# Patient Record
Sex: Female | Born: 1960 | Race: White | Hispanic: No | Marital: Married | Smoking: Former smoker
Health system: Southern US, Community
[De-identification: ages and names within clinical notes are randomized; demographics above are authoritative.]

## PROBLEM LIST (undated history)

## (undated) ENCOUNTER — Emergency Department (HOSPITAL_COMMUNITY): Admission: EM | Discharge status: Against Medical Advice | Payer: Private Health Insurance - Indemnity

## (undated) DIAGNOSIS — C44621 Squamous cell carcinoma of skin of unspecified upper limb, including shoulder: Secondary | ICD-10-CM

## (undated) DIAGNOSIS — M26629 Arthralgia of temporomandibular joint, unspecified side: Secondary | ICD-10-CM

## (undated) DIAGNOSIS — E785 Hyperlipidemia, unspecified: Secondary | ICD-10-CM

## (undated) DIAGNOSIS — T7840XA Allergy, unspecified, initial encounter: Secondary | ICD-10-CM

## (undated) DIAGNOSIS — J189 Pneumonia, unspecified organism: Secondary | ICD-10-CM

## (undated) DIAGNOSIS — R609 Edema, unspecified: Secondary | ICD-10-CM

## (undated) DIAGNOSIS — R51 Headache: Secondary | ICD-10-CM

## (undated) DIAGNOSIS — I10 Essential (primary) hypertension: Secondary | ICD-10-CM

## (undated) DIAGNOSIS — K219 Gastro-esophageal reflux disease without esophagitis: Secondary | ICD-10-CM

## (undated) HISTORY — DX: Headache: R51

## (undated) HISTORY — DX: Squamous cell carcinoma of skin of unspecified upper limb, including shoulder: C44.621

## (undated) HISTORY — DX: Gastro-esophageal reflux disease without esophagitis: K21.9

## (undated) HISTORY — DX: Allergy, unspecified, initial encounter: T78.40XA

## (undated) HISTORY — DX: Arthralgia of temporomandibular joint, unspecified side: M26.629

## (undated) HISTORY — DX: Hyperlipidemia, unspecified: E78.5

## (undated) HISTORY — DX: Edema, unspecified: R60.9

## (undated) HISTORY — PX: DENTAL SURGERY: SHX609

---

## 1999-05-20 ENCOUNTER — Encounter: Admission: RE | Admit: 1999-05-20 | Discharge: 1999-05-20 | Payer: Self-pay | Admitting: Obstetrics and Gynecology

## 1999-05-20 ENCOUNTER — Encounter: Payer: Self-pay | Admitting: Obstetrics and Gynecology

## 1999-11-18 ENCOUNTER — Encounter: Payer: Self-pay | Admitting: Obstetrics and Gynecology

## 1999-11-18 ENCOUNTER — Encounter: Admission: RE | Admit: 1999-11-18 | Discharge: 1999-11-18 | Payer: Self-pay | Admitting: Obstetrics and Gynecology

## 2000-12-16 ENCOUNTER — Encounter: Admission: RE | Admit: 2000-12-16 | Discharge: 2000-12-16 | Payer: Self-pay | Admitting: Obstetrics and Gynecology

## 2000-12-16 ENCOUNTER — Encounter: Payer: Self-pay | Admitting: Obstetrics and Gynecology

## 2001-07-22 ENCOUNTER — Other Ambulatory Visit: Admission: RE | Admit: 2001-07-22 | Discharge: 2001-07-22 | Payer: Self-pay | Admitting: Obstetrics and Gynecology

## 2002-04-12 ENCOUNTER — Encounter: Payer: Self-pay | Admitting: Obstetrics and Gynecology

## 2002-04-12 ENCOUNTER — Encounter: Admission: RE | Admit: 2002-04-12 | Discharge: 2002-04-12 | Payer: Self-pay | Admitting: Obstetrics and Gynecology

## 2002-10-17 ENCOUNTER — Other Ambulatory Visit: Admission: RE | Admit: 2002-10-17 | Discharge: 2002-10-17 | Payer: Self-pay | Admitting: Obstetrics and Gynecology

## 2003-05-26 ENCOUNTER — Encounter: Admission: RE | Admit: 2003-05-26 | Discharge: 2003-05-26 | Payer: Self-pay | Admitting: Obstetrics and Gynecology

## 2003-12-19 ENCOUNTER — Other Ambulatory Visit: Admission: RE | Admit: 2003-12-19 | Discharge: 2003-12-19 | Payer: Self-pay | Admitting: Obstetrics and Gynecology

## 2004-08-27 ENCOUNTER — Encounter: Admission: RE | Admit: 2004-08-27 | Discharge: 2004-08-27 | Payer: Self-pay | Admitting: Obstetrics and Gynecology

## 2004-10-25 ENCOUNTER — Ambulatory Visit: Payer: Self-pay | Admitting: Family Medicine

## 2004-12-12 ENCOUNTER — Other Ambulatory Visit: Admission: RE | Admit: 2004-12-12 | Discharge: 2004-12-12 | Payer: Self-pay | Admitting: Obstetrics and Gynecology

## 2005-08-22 ENCOUNTER — Ambulatory Visit: Payer: Self-pay | Admitting: Family Medicine

## 2005-08-27 ENCOUNTER — Ambulatory Visit: Payer: Self-pay | Admitting: Family Medicine

## 2005-08-28 ENCOUNTER — Encounter: Admission: RE | Admit: 2005-08-28 | Discharge: 2005-08-28 | Payer: Self-pay | Admitting: Obstetrics and Gynecology

## 2006-08-03 ENCOUNTER — Ambulatory Visit: Payer: Self-pay | Admitting: Family Medicine

## 2006-08-31 ENCOUNTER — Encounter: Admission: RE | Admit: 2006-08-31 | Discharge: 2006-08-31 | Payer: Self-pay | Admitting: Obstetrics and Gynecology

## 2007-03-04 ENCOUNTER — Ambulatory Visit: Payer: Self-pay | Admitting: Family Medicine

## 2007-03-04 DIAGNOSIS — R51 Headache: Secondary | ICD-10-CM

## 2007-03-04 DIAGNOSIS — R519 Headache, unspecified: Secondary | ICD-10-CM | POA: Insufficient documentation

## 2007-03-04 DIAGNOSIS — M26629 Arthralgia of temporomandibular joint, unspecified side: Secondary | ICD-10-CM

## 2007-07-19 ENCOUNTER — Telehealth: Payer: Self-pay | Admitting: Family Medicine

## 2007-09-09 ENCOUNTER — Ambulatory Visit: Payer: Self-pay | Admitting: Family Medicine

## 2007-09-09 DIAGNOSIS — J019 Acute sinusitis, unspecified: Secondary | ICD-10-CM

## 2007-09-13 ENCOUNTER — Telehealth: Payer: Self-pay | Admitting: Family Medicine

## 2007-09-27 ENCOUNTER — Encounter: Admission: RE | Admit: 2007-09-27 | Discharge: 2007-09-27 | Payer: Self-pay | Admitting: Obstetrics and Gynecology

## 2007-09-30 ENCOUNTER — Telehealth: Payer: Self-pay | Admitting: Family Medicine

## 2007-10-05 ENCOUNTER — Encounter: Admission: RE | Admit: 2007-10-05 | Discharge: 2007-10-05 | Payer: Self-pay | Admitting: Obstetrics and Gynecology

## 2007-11-10 ENCOUNTER — Ambulatory Visit: Payer: Self-pay | Admitting: Family Medicine

## 2007-11-10 DIAGNOSIS — R609 Edema, unspecified: Secondary | ICD-10-CM | POA: Insufficient documentation

## 2007-12-02 ENCOUNTER — Telehealth: Payer: Self-pay | Admitting: Family Medicine

## 2007-12-03 ENCOUNTER — Ambulatory Visit: Payer: Self-pay | Admitting: Family Medicine

## 2007-12-03 DIAGNOSIS — J309 Allergic rhinitis, unspecified: Secondary | ICD-10-CM | POA: Insufficient documentation

## 2007-12-06 LAB — CONVERTED CEMR LAB
Alkaline Phosphatase: 95 units/L (ref 39–117)
Basophils Relative: 0.8 % (ref 0.0–1.0)
CO2: 26 meq/L (ref 19–32)
Creatinine, Ser: 0.8 mg/dL (ref 0.4–1.2)
Eosinophils Absolute: 0.1 10*3/uL (ref 0.0–0.7)
Eosinophils Relative: 1.7 % (ref 0.0–5.0)
Glucose, Bld: 83 mg/dL (ref 70–99)
HCT: 41.8 % (ref 36.0–46.0)
Hemoglobin: 14.4 g/dL (ref 12.0–15.0)
Ketones, urine, test strip: NEGATIVE
Lymphocytes Relative: 22.6 % (ref 12.0–46.0)
MCHC: 34.4 g/dL (ref 30.0–36.0)
Monocytes Relative: 5.1 % (ref 3.0–12.0)
Neutro Abs: 4.7 10*3/uL (ref 1.4–7.7)
Neutrophils Relative %: 69.8 % (ref 43.0–77.0)
Nitrite: NEGATIVE
Protein, U semiquant: NEGATIVE
RBC: 4.61 M/uL (ref 3.87–5.11)
RDW: 11.6 % (ref 11.5–14.6)
Specific Gravity, Urine: 1.02
TSH: 1.51 microintl units/mL (ref 0.35–5.50)
Urobilinogen, UA: 0.2
WBC: 6.7 10*3/uL (ref 4.5–10.5)

## 2007-12-17 ENCOUNTER — Telehealth: Payer: Self-pay | Admitting: Family Medicine

## 2007-12-27 ENCOUNTER — Ambulatory Visit: Payer: Self-pay | Admitting: Family Medicine

## 2007-12-27 DIAGNOSIS — R059 Cough, unspecified: Secondary | ICD-10-CM | POA: Insufficient documentation

## 2007-12-27 DIAGNOSIS — R05 Cough: Secondary | ICD-10-CM

## 2008-01-20 ENCOUNTER — Ambulatory Visit: Payer: Self-pay | Admitting: Family Medicine

## 2008-03-15 ENCOUNTER — Ambulatory Visit: Payer: Self-pay | Admitting: Family Medicine

## 2008-03-31 ENCOUNTER — Telehealth: Payer: Self-pay | Admitting: Family Medicine

## 2008-07-13 ENCOUNTER — Telehealth (INDEPENDENT_AMBULATORY_CARE_PROVIDER_SITE_OTHER): Payer: Self-pay | Admitting: *Deleted

## 2008-07-25 ENCOUNTER — Ambulatory Visit: Payer: Self-pay | Admitting: Pulmonary Disease

## 2008-08-08 ENCOUNTER — Telehealth: Payer: Self-pay | Admitting: Pulmonary Disease

## 2008-08-15 ENCOUNTER — Ambulatory Visit: Payer: Self-pay | Admitting: Pulmonary Disease

## 2008-08-21 ENCOUNTER — Ambulatory Visit: Payer: Self-pay | Admitting: Family Medicine

## 2008-09-08 ENCOUNTER — Telehealth (INDEPENDENT_AMBULATORY_CARE_PROVIDER_SITE_OTHER): Payer: Self-pay | Admitting: *Deleted

## 2008-10-09 ENCOUNTER — Encounter: Admission: RE | Admit: 2008-10-09 | Discharge: 2008-10-09 | Payer: Self-pay | Admitting: Obstetrics and Gynecology

## 2008-10-24 ENCOUNTER — Ambulatory Visit: Payer: Self-pay | Admitting: Pulmonary Disease

## 2008-10-25 ENCOUNTER — Ambulatory Visit: Payer: Self-pay | Admitting: Cardiology

## 2008-11-09 ENCOUNTER — Telehealth (INDEPENDENT_AMBULATORY_CARE_PROVIDER_SITE_OTHER): Payer: Self-pay | Admitting: *Deleted

## 2008-11-15 ENCOUNTER — Telehealth (INDEPENDENT_AMBULATORY_CARE_PROVIDER_SITE_OTHER): Payer: Self-pay | Admitting: *Deleted

## 2008-11-27 ENCOUNTER — Telehealth (INDEPENDENT_AMBULATORY_CARE_PROVIDER_SITE_OTHER): Payer: Self-pay | Admitting: *Deleted

## 2008-12-12 ENCOUNTER — Telehealth (INDEPENDENT_AMBULATORY_CARE_PROVIDER_SITE_OTHER): Payer: Self-pay | Admitting: *Deleted

## 2009-03-05 ENCOUNTER — Ambulatory Visit: Payer: Self-pay | Admitting: Family Medicine

## 2009-03-05 DIAGNOSIS — I10 Essential (primary) hypertension: Secondary | ICD-10-CM

## 2009-03-06 LAB — CONVERTED CEMR LAB
ALT: 15 units/L (ref 0–35)
Alkaline Phosphatase: 93 units/L (ref 39–117)
BUN: 9 mg/dL (ref 6–23)
Calcium: 8.8 mg/dL (ref 8.4–10.5)
Chloride: 106 meq/L (ref 96–112)
Creatinine, Ser: 0.7 mg/dL (ref 0.4–1.2)
GFR calc non Af Amer: 94.77 mL/min (ref >60)
Glucose, Bld: 102 mg/dL — ABNORMAL HIGH (ref 70–99)
Lymphocytes Relative: 27.5 % (ref 12.0–46.0)
MCV: 91.9 fL (ref 78.0–100.0)
Neutrophils Relative %: 64.8 % (ref 43.0–77.0)
Potassium: 3.9 meq/L (ref 3.5–5.1)
RBC: 4.4 M/uL (ref 3.87–5.11)
RDW: 11.8 % (ref 11.5–14.6)
Sodium: 139 meq/L (ref 135–145)
Total Bilirubin: 0.6 mg/dL (ref 0.3–1.2)
Total Protein: 6.8 g/dL (ref 6.0–8.3)
WBC: 5.3 10*3/uL (ref 4.5–10.5)

## 2009-03-19 ENCOUNTER — Ambulatory Visit: Payer: Self-pay | Admitting: Pulmonary Disease

## 2009-03-27 ENCOUNTER — Ambulatory Visit: Payer: Self-pay | Admitting: Family Medicine

## 2009-04-24 ENCOUNTER — Ambulatory Visit: Payer: Self-pay | Admitting: Pulmonary Disease

## 2009-04-24 ENCOUNTER — Ambulatory Visit: Payer: Self-pay | Admitting: Family Medicine

## 2009-05-29 ENCOUNTER — Ambulatory Visit: Payer: Self-pay | Admitting: Pulmonary Disease

## 2009-10-23 ENCOUNTER — Encounter: Admission: RE | Admit: 2009-10-23 | Discharge: 2009-10-23 | Payer: Self-pay | Admitting: Obstetrics and Gynecology

## 2010-03-01 ENCOUNTER — Ambulatory Visit: Payer: Self-pay | Admitting: Family Medicine

## 2010-03-04 ENCOUNTER — Encounter: Payer: Self-pay | Admitting: Family Medicine

## 2010-03-04 LAB — CONVERTED CEMR LAB
Alkaline Phosphatase: 95 units/L (ref 39–117)
Basophils Absolute: 0 10*3/uL (ref 0.0–0.1)
Basophils Relative: 0.6 % (ref 0.0–3.0)
Bilirubin, Direct: 0.1 mg/dL (ref 0.0–0.3)
Calcium: 9 mg/dL (ref 8.4–10.5)
Chloride: 104 meq/L (ref 96–112)
Cholesterol: 231 mg/dL — ABNORMAL HIGH (ref 0–200)
Creatinine, Ser: 0.8 mg/dL (ref 0.4–1.2)
Direct LDL: 171 mg/dL
GFR calc non Af Amer: 85.84 mL/min (ref >60)
Glucose, Bld: 74 mg/dL (ref 70–99)
HCT: 41.4 % (ref 36.0–46.0)
HDL: 50.7 mg/dL (ref >39.00)
Hemoglobin: 14.4 g/dL (ref 12.0–15.0)
Lymphocytes Relative: 22.2 % (ref 12.0–46.0)
Monocytes Relative: 5 % (ref 3.0–12.0)
RDW: 12.9 % (ref 11.5–14.6)
Sodium: 140 meq/L (ref 135–145)
TSH: 0.85 microintl units/mL (ref 0.35–5.50)
Total Bilirubin: 0.6 mg/dL (ref 0.3–1.2)
Total CHOL/HDL Ratio: 5
Triglycerides: 115 mg/dL (ref 0.0–149.0)
VLDL: 23 mg/dL (ref 0.0–40.0)
WBC: 7.4 10*3/uL (ref 4.5–10.5)

## 2010-07-28 ENCOUNTER — Encounter: Payer: Self-pay | Admitting: Obstetrics and Gynecology

## 2010-08-06 NOTE — Miscellaneous (Signed)
  Clinical Lists Changes  Medications: Added new medication of ZOCOR 40 MG TABS (SIMVASTATIN) 1 once daily - Signed Rx of ZOCOR 40 MG TABS (SIMVASTATIN) 1 once daily;  #30 x 11;  Signed;  Entered by: Raechel Ache, RN;  Authorized by: Nelwyn Salisbury MD;  Method used: [facility] [phone]*, [address], [place], [place]  [address], Ph: [phone] or [phone], Fax: [phone]    Prescriptions: ZOCOR 40 MG TABS (SIMVASTATIN) 1 once daily  #30 x 11   Entered by:   Raechel Ache, RN   Authorized by:   Nelwyn Salisbury MD   Signed by:   Raechel Ache, RN on 03/04/2010   Method used:   Electronically to        [facility] [phone]* (retail)       [address] [place], [place]  [address]       Ph: [phone] or [phone]       Fax: [phone]   RxID:   [id number]

## 2010-08-06 NOTE — Assessment & Plan Note (Signed)
Summary: HTN F/U - MED CK / REFILL  // RS   Vital Signs:  Patient profile:   50 year old female Weight:      182 pounds BMI:     33.41 BP sitting:   140 / 86  (left arm) Cuff size:   regular  Vitals Entered By: Raechel Ache, RN (March 01, 2010 9:43 AM) CC: Med check.   History of Present Illness: Here to follow up on HTN. She feels fine, and her BP is steady at home in the 130s over 80s.   Allergies: 1)  ! Augmentin  Past History:  Past Medical History: sees Dr. Jarold Song for GYN exams COUGH (ICD-786.2) ALLERGIC RHINITIS (ICD-477.9) DEPENDENT EDEMA (ICD-782.3) TMJ PAIN (ICD-524.62) HEADACHE (ICD-784.0)     Review of Systems  The patient denies anorexia, fever, weight loss, weight gain, vision loss, decreased hearing, hoarseness, chest pain, syncope, dyspnea on exertion, peripheral edema, prolonged cough, headaches, hemoptysis, abdominal pain, melena, hematochezia, severe indigestion/heartburn, hematuria, incontinence, genital sores, muscle weakness, suspicious skin lesions, transient blindness, difficulty walking, depression, unusual weight change, abnormal bleeding, enlarged lymph nodes, angioedema, breast masses, and testicular masses.    Physical Exam  General:  overweight-appearing.   Neck:  No deformities, masses, or tenderness noted. Lungs:  Normal respiratory effort, chest expands symmetrically. Lungs are clear to auscultation, no crackles or wheezes. Heart:  Normal rate and regular rhythm. S1 and S2 normal without gallop, murmur, click, rub or other extra sounds. Extremities:  no edema   Impression & Recommendations:  Problem # 1:  HYPERTENSION (ICD-401.9)  Her updated medication list for this problem includes:    Furosemide 40 Mg Tabs (Furosemide) ..... Once daily    Diovan 320 Mg Tabs (Valsartan) ..... Once daily  Orders: UA Dipstick w/o Micro (automated)  (81003) Venipuncture ([address]109) TLB-Lipid Panel (80061-LIPID) TLB-BMP (Basic Metabolic  Panel-BMET) (80048-METABOL) TLB-CBC Platelet - w/Differential (85025-CBCD) TLB-Hepatic/Liver Function Pnl (80076-HEPATIC) TLB-TSH (Thyroid Stimulating Hormone) (84443-TSH)  Problem # 2:  DEPENDENT EDEMA (ICD-782.3)  Her updated medication list for this problem includes:    Furosemide 40 Mg Tabs (Furosemide) ..... Once daily  Complete Medication List: 1)  Sprintec 28 0.25-35 Mg-mcg Tabs (Norgestimate-eth estradiol) .Marland Kitchen.. 1 by mouth once daily 2)  Furosemide 40 Mg Tabs (Furosemide) .... Once daily 3)  Diovan 320 Mg Tabs (Valsartan) .... Once daily  Patient Instructions: 1)  get labs  Prescriptions: DIOVAN 320 MG TABS (VALSARTAN) once daily  #30 x 11   Entered and Authorized by:   Nelwyn Salisbury MD   Signed by:   Nelwyn Salisbury MD on 03/01/2010   Method used:   Electronically to        [facility] [phone]* (retail)       [address] [place], [place]  [address]       Ph: [phone] or [phone]       Fax: [phone]   RxID:   [id number] FUROSEMIDE 40 MG TABS (FUROSEMIDE) once daily  #30 x 11   Entered and Authorized by:   Nelwyn Salisbury MD   Signed by:   Nelwyn Salisbury MD on 03/01/2010   Method used:   Electronically to        [facility] [phone]* (retail)       [address] [place], [place]  [address]       Ph: [phone] or [phone]  Fax: [phone]   RxID:   [id number]   Appended Document: Orders Update    Clinical Lists Changes  Orders: Added new Service order of Specimen Handling (30865) - Signed      Appended Document: HTN F/U - MED CK / REFILL  // RS   ANTICOAGULATION RECORD  NEW REGIMEN & LAB RESULTS Regimen:   (no change)    Laboratory Results   Urine Tests    Routine Urinalysis   Color: yellow Appearance: Clear Glucose: negative   (Normal Range: Negative) Bilirubin: negative   (Normal Range: Negative) Ketone: negative   (Normal Range: Negative) Spec. Gravity: 1.015   (Normal Range:  1.003-1.035) Blood: negative   (Normal Range: Negative) pH: 5.0   (Normal Range: 5.0-8.0) Protein: negative   (Normal Range: Negative) Urobilinogen: 0.2   (Normal Range: 0-1) Nitrite: negative   (Normal Range: Negative) Leukocyte Esterace: negative   (Normal Range: Negative)    Comments: Rita Ohara  March 01, 2010 12:04 PM

## 2010-10-04 ENCOUNTER — Other Ambulatory Visit: Payer: Self-pay | Admitting: Obstetrics and Gynecology

## 2010-10-04 DIAGNOSIS — Z1231 Encounter for screening mammogram for malignant neoplasm of breast: Secondary | ICD-10-CM

## 2010-10-25 ENCOUNTER — Ambulatory Visit
Admission: RE | Admit: 2010-10-25 | Discharge: 2010-10-25 | Disposition: A | Discharge status: Home / Self Care | Payer: Managed Care, Other (non HMO) | Source: Ambulatory Visit | Attending: Obstetrics and Gynecology | Admitting: Obstetrics and Gynecology

## 2010-10-25 DIAGNOSIS — Z1231 Encounter for screening mammogram for malignant neoplasm of breast: Secondary | ICD-10-CM

## 2011-03-04 ENCOUNTER — Ambulatory Visit (INDEPENDENT_AMBULATORY_CARE_PROVIDER_SITE_OTHER): Payer: Managed Care, Other (non HMO) | Admitting: Family Medicine

## 2011-03-04 ENCOUNTER — Encounter: Payer: Self-pay | Admitting: Family Medicine

## 2011-03-04 VITALS — BP 138/90 | HR 114 | Temp 98.3°F | Wt 185.0 lb

## 2011-03-04 DIAGNOSIS — E785 Hyperlipidemia, unspecified: Secondary | ICD-10-CM

## 2011-03-04 DIAGNOSIS — I10 Essential (primary) hypertension: Secondary | ICD-10-CM

## 2011-03-04 MED ORDER — VALSARTAN 320 MG PO TABS: 320.0000 mg | ORAL_TABLET | Freq: Every day | ORAL | Status: DC

## 2011-03-04 MED ORDER — SIMVASTATIN 40 MG PO TABS: 40.0000 mg | ORAL_TABLET | Freq: Every day | ORAL | Status: DC

## 2011-03-04 MED ORDER — FUROSEMIDE 40 MG PO TABS: 40.0000 mg | ORAL_TABLET | Freq: Every day | ORAL | Status: DC

## 2011-03-04 NOTE — Progress Notes (Signed)
  Subjective:    Patient ID: Heidi Crawford, female    DOB: [birth year], 51 y.o.   MRN: [id number]  HPI Here for refills. She feels fine but she knows she needs to lose weight. She has put on 3 lbs form a year ago. Her BP at home is stable.    Review of Systems  Constitutional: Negative.   Respiratory: Negative.   Cardiovascular: Negative.        Objective:   Physical Exam  Constitutional: She appears well-developed and well-nourished.  Neck: No thyromegaly present.  Cardiovascular: Normal rate, regular rhythm, normal heart sounds and intact distal pulses.   Pulmonary/Chest: Effort normal and breath sounds normal.  Lymphadenopathy:    She has no cervical adenopathy.          Assessment & Plan:  We discussed her diet and exercise habits. She will set up fasting labs soon.

## 2011-03-05 ENCOUNTER — Other Ambulatory Visit: Payer: Self-pay | Admitting: Family Medicine

## 2011-03-05 NOTE — Telephone Encounter (Signed)
Script sent e-scribe 

## 2011-03-19 ENCOUNTER — Other Ambulatory Visit: Payer: Self-pay | Admitting: Family Medicine

## 2011-07-14 ENCOUNTER — Encounter: Payer: Self-pay | Admitting: *Deleted

## 2011-07-14 ENCOUNTER — Emergency Department (INDEPENDENT_AMBULATORY_CARE_PROVIDER_SITE_OTHER)
Admission: EM | Admit: 2011-07-14 | Discharge: 2011-07-14 | Disposition: A | Discharge status: Home / Self Care | Payer: Managed Care, Other (non HMO) | Source: Home / Self Care

## 2011-07-14 DIAGNOSIS — Z23 Encounter for immunization: Secondary | ICD-10-CM

## 2011-07-14 HISTORY — DX: Essential (primary) hypertension: I10

## 2011-07-14 MED ORDER — INFLUENZA VAC TYP A&B SURF ANT IM INJ
0.5000 mL | INJECTION | INTRAMUSCULAR | Status: AC
  Administered 2011-07-14: 0.5 mL via INTRAMUSCULAR

## 2011-07-14 NOTE — ED Notes (Signed)
The pt is here today for a flu vaccine.  

## 2011-10-09 ENCOUNTER — Other Ambulatory Visit: Payer: Self-pay | Admitting: Obstetrics and Gynecology

## 2011-10-09 DIAGNOSIS — Z1231 Encounter for screening mammogram for malignant neoplasm of breast: Secondary | ICD-10-CM

## 2011-10-16 ENCOUNTER — Telehealth: Payer: Self-pay | Admitting: Family Medicine

## 2011-10-16 MED ORDER — SIMVASTATIN 40 MG PO TABS: 40.0000 mg | ORAL_TABLET | Freq: Every day | ORAL | Status: DC

## 2011-10-16 MED ORDER — VALSARTAN 320 MG PO TABS: 320.0000 mg | ORAL_TABLET | Freq: Every day | ORAL | Status: DC

## 2011-10-16 MED ORDER — FUROSEMIDE 40 MG PO TABS: 40.0000 mg | ORAL_TABLET | Freq: Every day | ORAL | Status: DC

## 2011-10-16 NOTE — Telephone Encounter (Signed)
Scripts were sent e-scribe and pt requested a 90 day supply.

## 2011-10-28 ENCOUNTER — Ambulatory Visit: Payer: Managed Care, Other (non HMO)

## 2011-10-29 ENCOUNTER — Ambulatory Visit: Payer: Managed Care, Other (non HMO)

## 2011-11-13 ENCOUNTER — Ambulatory Visit: Payer: Managed Care, Other (non HMO)

## 2011-12-12 ENCOUNTER — Ambulatory Visit
Admission: RE | Admit: 2011-12-12 | Discharge: 2011-12-12 | Disposition: A | Discharge status: Home / Self Care | Payer: Private Health Insurance - Indemnity | Source: Ambulatory Visit | Attending: Obstetrics and Gynecology | Admitting: Obstetrics and Gynecology

## 2011-12-12 DIAGNOSIS — Z1231 Encounter for screening mammogram for malignant neoplasm of breast: Secondary | ICD-10-CM

## 2012-01-23 ENCOUNTER — Other Ambulatory Visit: Payer: Self-pay | Admitting: Family Medicine

## 2012-01-23 NOTE — Telephone Encounter (Signed)
Can we do refills on these ? ( 90 day supply )

## 2012-05-04 ENCOUNTER — Encounter: Payer: Self-pay | Admitting: Family Medicine

## 2012-05-04 ENCOUNTER — Ambulatory Visit (INDEPENDENT_AMBULATORY_CARE_PROVIDER_SITE_OTHER): Payer: Private Health Insurance - Indemnity | Admitting: Family Medicine

## 2012-05-04 VITALS — BP 128/90 | HR 90 | Temp 98.4°F | Wt 191.0 lb

## 2012-05-04 DIAGNOSIS — I10 Essential (primary) hypertension: Secondary | ICD-10-CM

## 2012-05-04 DIAGNOSIS — E785 Hyperlipidemia, unspecified: Secondary | ICD-10-CM | POA: Insufficient documentation

## 2012-05-04 DIAGNOSIS — R05 Cough: Secondary | ICD-10-CM

## 2012-05-04 DIAGNOSIS — Z23 Encounter for immunization: Secondary | ICD-10-CM

## 2012-05-04 DIAGNOSIS — R609 Edema, unspecified: Secondary | ICD-10-CM

## 2012-05-04 LAB — CBC WITH DIFFERENTIAL/PLATELET
Eosinophils Relative: 3 % (ref 0.0–5.0)
Monocytes Relative: 7.2 % (ref 3.0–12.0)
Neutrophils Relative %: 70.9 % (ref 43.0–77.0)
Platelets: 273 10*3/uL (ref 150.0–400.0)
WBC: 6.2 10*3/uL (ref 4.5–10.5)

## 2012-05-04 LAB — LIPID PANEL
Cholesterol: 169 mg/dL (ref 0–200)
HDL: 41.2 mg/dL (ref >39.00)
Total CHOL/HDL Ratio: 4
Triglycerides: 66 mg/dL (ref 0.0–149.0)
VLDL: 13.2 mg/dL (ref 0.0–40.0)

## 2012-05-04 LAB — TSH: TSH: 0.84 u[IU]/mL (ref 0.35–5.50)

## 2012-05-04 LAB — POCT URINALYSIS DIPSTICK
Bilirubin, UA: NEGATIVE
Blood, UA: NEGATIVE
Glucose, UA: NEGATIVE
Ketones, UA: NEGATIVE
Spec Grav, UA: 1.02
pH, UA: 5.5

## 2012-05-04 LAB — HEPATIC FUNCTION PANEL
Albumin: 3.8 g/dL (ref 3.5–5.2)
Total Bilirubin: 0.6 mg/dL (ref 0.3–1.2)

## 2012-05-04 LAB — BASIC METABOLIC PANEL
BUN: 11 mg/dL (ref 6–23)
Glucose, Bld: 85 mg/dL (ref 70–99)
Sodium: 141 mEq/L (ref 135–145)

## 2012-05-04 MED ORDER — SIMVASTATIN 40 MG PO TABS: 40.0000 mg | ORAL_TABLET | Freq: Every day | ORAL | Status: DC

## 2012-05-04 MED ORDER — VALSARTAN 320 MG PO TABS: 320.0000 mg | ORAL_TABLET | Freq: Every day | ORAL | Status: DC

## 2012-05-04 MED ORDER — FUROSEMIDE 40 MG PO TABS: 40.0000 mg | ORAL_TABLET | Freq: Every day | ORAL | Status: DC

## 2012-05-04 NOTE — Progress Notes (Signed)
  Subjective:    Patient ID: Heidi Crawford, female    DOB: 12/21/1960, 51 y.o.   MRN: [id number]  HPI Here to follow up. She feels good except her chronic cough still plagues her daily. This has been going on for over 3 years now. She had seen Pulmonary a few years ago and they could not find a cause. Her BP is stable.    Review of Systems  Constitutional: Negative.   Respiratory: Positive for cough. Negative for apnea, choking, chest tightness, shortness of breath, wheezing and stridor.   Cardiovascular: Negative.        Objective:   Physical Exam  Constitutional: She appears well-developed and well-nourished.  Neck: No thyromegaly present.  Cardiovascular: Normal rate, regular rhythm, normal heart sounds and intact distal pulses.   Pulmonary/Chest: Effort normal and breath sounds normal.  Musculoskeletal: She exhibits no edema.  Lymphadenopathy:    She has no cervical adenopathy.          Assessment & Plan:  Refilled the meds. Sent for fasting labs. Refer to Allergy to work up her cough.

## 2012-07-07 HISTORY — PX: OTHER SURGICAL HISTORY: SHX169

## 2012-08-16 ENCOUNTER — Telehealth: Payer: Self-pay | Admitting: Family Medicine

## 2012-08-16 NOTE — Telephone Encounter (Signed)
Patient Information:  Caller Name: Amy  Phone: [phone]  Patient: Heidi, Crawford  Gender: Female  DOB: 11/16/1960  Age: 52 Years  PCP: Gershon Crane [facility])  Pregnant: No  Office Follow Up:  Does the office need to follow up with this patient?: No  Instructions For The Office: N/A  RN Note:  Patient wanted to know if there is anything specific OTC she can take for her symptoms in addition to having HTN? Please call her with any recommendations.  Symptoms  Reason For Call & Symptoms: Dry cough, nasal congestion, sore throat.  Reviewed Health History In EMR: Yes  Reviewed Medications In EMR: Yes  Reviewed Allergies In EMR: Yes  Reviewed Surgeries / Procedures: Yes  Date of Onset of Symptoms: 08/15/2012 OB / GYN:  LMP: Unknown  Guideline(s) Used:  Colds  Disposition Per Guideline:   Home Care  Reason For Disposition Reached:   Colds with no complications  Advice Given:  Reassurance  It sounds like an uncomplicated cold that we can treat at home.  Colds are very common and may make you feel uncomfortable.  Colds are caused by viruses, and no medicine or "shot" will cure an uncomplicated cold.  For a Stuffy Nose - Use Nasal Washes:  Introduction: Saline (salt water) nasal irrigation (nasal wash) is an effective and simple home remedy for treating stuffy nose and sinus congestion. The nose can be irrigated by pouring, spraying, or squirting salt water into the nose and then letting it run back out.  How it Helps: The salt water rinses out excess mucus, washes out any irritants (dust, allergens) that might be present, and moistens the nasal cavity.  Humidifier:  If the air in your home is dry, use a cool-mist humidifier  Expected Course:   Fever may last 2-3 days  Nasal discharge 7-14 days  Cough up to 2-3 weeks.  Call Back If:  Difficulty breathing occurs  Nasal discharge lasts more than 10 days  Cough lasts more than 3 weeks  You become worse

## 2012-12-20 ENCOUNTER — Other Ambulatory Visit: Payer: Self-pay

## 2012-12-20 DIAGNOSIS — Z1231 Encounter for screening mammogram for malignant neoplasm of breast: Secondary | ICD-10-CM

## 2012-12-22 ENCOUNTER — Encounter: Payer: Self-pay | Admitting: Family Medicine

## 2013-01-25 ENCOUNTER — Ambulatory Visit
Admission: RE | Admit: 2013-01-25 | Discharge: 2013-01-25 | Disposition: A | Discharge status: Home / Self Care | Payer: Medicare HMO | Source: Ambulatory Visit

## 2013-01-25 DIAGNOSIS — Z1231 Encounter for screening mammogram for malignant neoplasm of breast: Secondary | ICD-10-CM

## 2013-02-02 ENCOUNTER — Encounter: Payer: Self-pay | Admitting: Family Medicine

## 2013-04-01 MED ORDER — OMEPRAZOLE 20 MG PO CPDR: 20.0000 mg | DELAYED_RELEASE_CAPSULE | Freq: Every day | ORAL | Status: DC

## 2013-04-01 MED ORDER — OMEPRAZOLE 20 MG PO CPDR: 20.0000 mg | DELAYED_RELEASE_CAPSULE | Freq: Two times a day (BID) | ORAL | Status: DC

## 2013-05-05 ENCOUNTER — Ambulatory Visit (INDEPENDENT_AMBULATORY_CARE_PROVIDER_SITE_OTHER): Payer: Medicare HMO | Admitting: Family Medicine

## 2013-05-05 ENCOUNTER — Other Ambulatory Visit: Payer: Self-pay | Admitting: Family Medicine

## 2013-05-05 ENCOUNTER — Encounter: Payer: Self-pay | Admitting: Family Medicine

## 2013-05-05 VITALS — BP 160/80 | HR 109 | Temp 98.6°F | Wt 194.0 lb

## 2013-05-05 DIAGNOSIS — E785 Hyperlipidemia, unspecified: Secondary | ICD-10-CM

## 2013-05-05 DIAGNOSIS — I10 Essential (primary) hypertension: Secondary | ICD-10-CM

## 2013-05-05 DIAGNOSIS — R609 Edema, unspecified: Secondary | ICD-10-CM

## 2013-05-05 DIAGNOSIS — Z23 Encounter for immunization: Secondary | ICD-10-CM

## 2013-05-05 DIAGNOSIS — J309 Allergic rhinitis, unspecified: Secondary | ICD-10-CM

## 2013-05-05 LAB — BASIC METABOLIC PANEL
BUN: 13 mg/dL (ref 6–23)
CO2: 27 mEq/L (ref 19–32)
Calcium: 9.2 mg/dL (ref 8.4–10.5)
Chloride: 106 mEq/L (ref 96–112)
Creatinine, Ser: 0.8 mg/dL (ref 0.4–1.2)
Glucose, Bld: 91 mg/dL (ref 70–99)

## 2013-05-05 LAB — POCT URINALYSIS DIPSTICK
Bilirubin, UA: NEGATIVE
Blood, UA: NEGATIVE
Nitrite, UA: NEGATIVE
Protein, UA: NEGATIVE
Spec Grav, UA: 1.01
Urobilinogen, UA: 0.2

## 2013-05-05 LAB — LIPID PANEL
Cholesterol: 192 mg/dL (ref 0–200)
HDL: 41.2 mg/dL (ref >39.00)
LDL Cholesterol: 135 mg/dL — ABNORMAL HIGH (ref 0–99)
Total CHOL/HDL Ratio: 5
Triglycerides: 77 mg/dL (ref 0.0–149.0)

## 2013-05-05 LAB — HEPATIC FUNCTION PANEL
ALT: 14 U/L (ref 0–35)
Albumin: 4.1 g/dL (ref 3.5–5.2)
Bilirubin, Direct: 0 mg/dL (ref 0.0–0.3)
Total Bilirubin: 0.8 mg/dL (ref 0.3–1.2)
Total Protein: 7.6 g/dL (ref 6.0–8.3)

## 2013-05-05 LAB — CBC WITH DIFFERENTIAL/PLATELET
Eosinophils Absolute: 0.1 10*3/uL (ref 0.0–0.7)
HCT: 42.1 % (ref 36.0–46.0)
Lymphs Abs: 1.1 10*3/uL (ref 0.7–4.0)
MCHC: 34.2 g/dL (ref 30.0–36.0)
MCV: 91.1 fl (ref 78.0–100.0)
Monocytes Absolute: 0.4 10*3/uL (ref 0.1–1.0)
Neutrophils Relative %: 70.9 % (ref 43.0–77.0)
Platelets: 271 10*3/uL (ref 150.0–400.0)
RDW: 12.9 % (ref 11.5–14.6)

## 2013-05-05 MED ORDER — FLUTICASONE PROPIONATE 50 MCG/ACT NA SUSP: 2.0000 | Freq: Every day | NASAL | Status: DC

## 2013-05-05 MED ORDER — OMEPRAZOLE 20 MG PO CPDR: 20.0000 mg | DELAYED_RELEASE_CAPSULE | Freq: Two times a day (BID) | ORAL | Status: DC

## 2013-05-05 MED ORDER — VALSARTAN 320 MG PO TABS: ORAL_TABLET | ORAL | Status: DC

## 2013-05-05 MED ORDER — SIMVASTATIN 40 MG PO TABS: ORAL_TABLET | ORAL | Status: DC

## 2013-05-05 MED ORDER — FUROSEMIDE 40 MG PO TABS: ORAL_TABLET | ORAL | Status: DC

## 2013-05-05 NOTE — Progress Notes (Signed)
  Subjective:    Patient ID: Heidi Crawford, female    DOB: 1960/10/21, 52 y.o.   MRN: [id number]  HPI Here to follow up. She feels great. Her BP at home runs in the range of 120s over 70s.    Review of Systems  Constitutional: Negative.   Respiratory: Negative.   Cardiovascular: Negative.        Objective:   Physical Exam  Constitutional: She appears well-developed and well-nourished.  Neck: No thyromegaly present.  Cardiovascular: Normal rate, regular rhythm, normal heart sounds and intact distal pulses.   Pulmonary/Chest: Effort normal and breath sounds normal.  Lymphadenopathy:    She has no cervical adenopathy.          Assessment & Plan:  Refilled meds. Get labs.

## 2013-05-06 NOTE — Progress Notes (Signed)
Quick Note:  I released results in my chart. ______ 

## 2014-04-21 ENCOUNTER — Other Ambulatory Visit: Payer: Self-pay

## 2014-05-04 ENCOUNTER — Encounter: Payer: Self-pay | Admitting: Family Medicine

## 2014-05-05 MED ORDER — FUROSEMIDE 40 MG PO TABS: ORAL_TABLET | ORAL | Status: DC

## 2014-05-05 MED ORDER — VALSARTAN 320 MG PO TABS: ORAL_TABLET | ORAL | Status: DC

## 2014-07-04 ENCOUNTER — Other Ambulatory Visit: Payer: Self-pay | Admitting: Family Medicine

## 2014-07-04 MED ORDER — VALSARTAN 320 MG PO TABS: ORAL_TABLET | ORAL | Status: DC

## 2014-07-04 NOTE — Telephone Encounter (Signed)
Pt request refill valsartan (DIOVAN) 320 MG tablet  [facility] [address] [facility]  Pt has made appt for 07/17/14. Can you refill until  appt?

## 2014-07-04 NOTE — Telephone Encounter (Signed)
Refill sent.

## 2014-07-06 ENCOUNTER — Other Ambulatory Visit: Payer: Self-pay | Admitting: Family Medicine

## 2014-07-17 ENCOUNTER — Encounter: Payer: Self-pay | Admitting: Family Medicine

## 2014-07-17 ENCOUNTER — Ambulatory Visit (INDEPENDENT_AMBULATORY_CARE_PROVIDER_SITE_OTHER): Payer: BLUE CROSS/BLUE SHIELD | Admitting: Family Medicine

## 2014-07-17 ENCOUNTER — Encounter: Payer: Self-pay | Admitting: Internal Medicine

## 2014-07-17 VITALS — BP 131/76 | HR 97 | Temp 98.2°F | Ht 62.0 in | Wt 195.0 lb

## 2014-07-17 DIAGNOSIS — E785 Hyperlipidemia, unspecified: Secondary | ICD-10-CM

## 2014-07-17 DIAGNOSIS — Z23 Encounter for immunization: Secondary | ICD-10-CM

## 2014-07-17 DIAGNOSIS — Z0000 Encounter for general adult medical examination without abnormal findings: Secondary | ICD-10-CM

## 2014-07-17 DIAGNOSIS — I10 Essential (primary) hypertension: Secondary | ICD-10-CM

## 2014-07-17 LAB — BASIC METABOLIC PANEL
BUN: 12 mg/dL (ref 6–23)
CALCIUM: 9.2 mg/dL (ref 8.4–10.5)
CO2: 27 mEq/L (ref 19–32)
CREATININE: 0.8 mg/dL (ref 0.4–1.2)
Chloride: 105 mEq/L (ref 96–112)
GFR: 78.39 mL/min (ref >60.00)
GLUCOSE: 94 mg/dL (ref 70–99)
POTASSIUM: 3.8 meq/L (ref 3.5–5.1)
SODIUM: 140 meq/L (ref 135–145)

## 2014-07-17 LAB — CBC WITH DIFFERENTIAL/PLATELET
Basophils Absolute: 0.1 10*3/uL (ref 0.0–0.1)
Basophils Relative: 0.8 % (ref 0.0–3.0)
Eosinophils Absolute: 0.1 10*3/uL (ref 0.0–0.7)
Eosinophils Relative: 1.8 % (ref 0.0–5.0)
HEMATOCRIT: 44.4 % (ref 36.0–46.0)
Hemoglobin: 14.8 g/dL (ref 12.0–15.0)
Lymphocytes Relative: 18.6 % (ref 12.0–46.0)
Lymphs Abs: 1.3 10*3/uL (ref 0.7–4.0)
MCHC: 33.3 g/dL (ref 30.0–36.0)
MCV: 92.7 fl (ref 78.0–100.0)
MONO ABS: 0.3 10*3/uL (ref 0.1–1.0)
Monocytes Relative: 4.8 % (ref 3.0–12.0)
Neutro Abs: 5.2 10*3/uL (ref 1.4–7.7)
Neutrophils Relative %: 74 % (ref 43.0–77.0)
PLATELETS: 316 10*3/uL (ref 150.0–400.0)
RBC: 4.79 Mil/uL (ref 3.87–5.11)
RDW: 12.9 % (ref 11.5–15.5)
WBC: 7.1 10*3/uL (ref 4.0–10.5)

## 2014-07-17 LAB — POCT URINALYSIS DIPSTICK
Bilirubin, UA: NEGATIVE
Blood, UA: NEGATIVE
Glucose, UA: NEGATIVE
Ketones, UA: NEGATIVE
LEUKOCYTES UA: NEGATIVE
Nitrite, UA: NEGATIVE
PROTEIN UA: NEGATIVE
Spec Grav, UA: 1.015
Urobilinogen, UA: 0.2
pH, UA: 5

## 2014-07-17 LAB — HEPATIC FUNCTION PANEL
ALT: 12 U/L (ref 0–35)
AST: 16 U/L (ref 0–37)
Albumin: 4.1 g/dL (ref 3.5–5.2)
Alkaline Phosphatase: 95 U/L (ref 39–117)
BILIRUBIN DIRECT: 0 mg/dL (ref 0.0–0.3)
BILIRUBIN TOTAL: 0.5 mg/dL (ref 0.2–1.2)
Total Protein: 7.6 g/dL (ref 6.0–8.3)

## 2014-07-17 LAB — LIPID PANEL
Cholesterol: 192 mg/dL (ref 0–200)
HDL: 44.5 mg/dL (ref >39.00)
LDL Cholesterol: 132 mg/dL — ABNORMAL HIGH (ref 0–99)
NonHDL: 147.5
Total CHOL/HDL Ratio: 4
Triglycerides: 79 mg/dL (ref 0.0–149.0)
VLDL: 15.8 mg/dL (ref 0.0–40.0)

## 2014-07-17 LAB — TSH: TSH: 1.25 u[IU]/mL (ref 0.35–4.50)

## 2014-07-17 MED ORDER — SIMVASTATIN 40 MG PO TABS: ORAL_TABLET | ORAL | Status: DC

## 2014-07-17 MED ORDER — VALSARTAN 320 MG PO TABS: ORAL_TABLET | ORAL | Status: DC

## 2014-07-17 MED ORDER — FUROSEMIDE 40 MG PO TABS: ORAL_TABLET | ORAL | Status: DC

## 2014-07-17 NOTE — Progress Notes (Signed)
Pre visit review using our clinic review tool, if applicable. No additional management support is needed unless otherwise documented below in the visit note. 

## 2014-07-17 NOTE — Progress Notes (Signed)
   Subjective:    Patient ID: Heidi Crawford, female    DOB: [birth year], 54 y.o.   MRN: [id number]  HPI Here to follow up. She feels well. Her cough has resolved so she has stopped taking all allergy meds and all GERD meds. Her BP is stable.   Review of Systems  Constitutional: Negative.   Respiratory: Negative.   Cardiovascular: Negative.   Gastrointestinal: Negative.        Objective:   Physical Exam  Constitutional: She appears well-developed and well-nourished.  Neck: No thyromegaly present.  Cardiovascular: Normal rate, regular rhythm, normal heart sounds and intact distal pulses.   Pulmonary/Chest: Effort normal and breath sounds normal.  Lymphadenopathy:    She has no cervical adenopathy.          Assessment & Plan:  She is doing well. Get fasting labs.

## 2014-07-18 ENCOUNTER — Telehealth: Payer: Self-pay | Admitting: Family Medicine

## 2014-07-18 NOTE — Telephone Encounter (Signed)
emmi eamiled

## 2014-08-25 ENCOUNTER — Ambulatory Visit (AMBULATORY_SURGERY_CENTER): Payer: Self-pay | Admitting: *Deleted

## 2014-08-25 VITALS — Ht 62.0 in | Wt 199.6 lb

## 2014-08-25 DIAGNOSIS — Z1211 Encounter for screening for malignant neoplasm of colon: Secondary | ICD-10-CM

## 2014-08-25 MED ORDER — MOVIPREP 100 G PO SOLR: 1.0000 | Freq: Once | ORAL | Status: DC

## 2014-08-25 NOTE — Progress Notes (Signed)
No egg or soy allergy No home 02 use No diet pills No problems with past sedation Pt declined emmi video

## 2014-09-08 ENCOUNTER — Encounter: Payer: Self-pay | Admitting: Internal Medicine

## 2014-09-08 ENCOUNTER — Ambulatory Visit (AMBULATORY_SURGERY_CENTER): Payer: BLUE CROSS/BLUE SHIELD | Admitting: Internal Medicine

## 2014-09-08 VITALS — BP 158/83 | HR 83 | Temp 96.5°F | Resp 17 | Ht 62.0 in | Wt 199.0 lb

## 2014-09-08 DIAGNOSIS — Z1211 Encounter for screening for malignant neoplasm of colon: Secondary | ICD-10-CM

## 2014-09-08 HISTORY — PX: COLONOSCOPY: SHX174

## 2014-09-08 MED ORDER — SODIUM CHLORIDE 0.9 % IV SOLN: 500.0000 mL | INTRAVENOUS | Status: DC

## 2014-09-08 NOTE — Patient Instructions (Signed)
Discharge instructions given. Normal exam. Resume previous medications. YOU HAD AN ENDOSCOPIC PROCEDURE TODAY AT THE [facility]:   Refer to the procedure report that was given to you for any specific questions about what was found during the examination.  If the procedure report does not answer your questions, please call your gastroenterologist to clarify.  If you requested that your care partner not be given the details of your procedure findings, then the procedure report has been included in a sealed envelope for you to review at your convenience later.  YOU SHOULD EXPECT: Some feelings of bloating in the abdomen. Passage of more gas than usual.  Walking can help get rid of the air that was put into your GI tract during the procedure and reduce the bloating. If you had a lower endoscopy (such as a colonoscopy or flexible sigmoidoscopy) you may notice spotting of blood in your stool or on the toilet paper. If you underwent a bowel prep for your procedure, you may not have a normal bowel movement for a few days.  Please Note:  You might notice some irritation and congestion in your nose or some drainage.  This is from the oxygen used during your procedure.  There is no need for concern and it should clear up in a day or so.  SYMPTOMS TO REPORT IMMEDIATELY:   Following lower endoscopy (colonoscopy or flexible sigmoidoscopy):  Excessive amounts of blood in the stool  Significant tenderness or worsening of abdominal pains  Swelling of the abdomen that is new, acute  Fever of 100F or higher   For urgent or emergent issues, a gastroenterologist can be reached at any hour by calling [phone].   DIET: Your first meal following the procedure should be a small meal and then it is ok to progress to your normal diet. Heavy or fried foods are harder to digest and may make you feel nauseous or bloated.  Likewise, meals heavy in dairy and vegetables can increase bloating.  Drink plenty  of fluids but you should avoid alcoholic beverages for 24 hours.  ACTIVITY:  You should plan to take it easy for the rest of today and you should NOT DRIVE or use heavy machinery until tomorrow (because of the sedation medicines used during the test).    FOLLOW UP: Our staff will call the number listed on your records the next business day following your procedure to check on you and address any questions or concerns that you may have regarding the information given to you following your procedure. If we do not reach you, we will leave a message.  However, if you are feeling well and you are not experiencing any problems, there is no need to return our call.  We will assume that you have returned to your regular daily activities without incident.  If any biopsies were taken you will be contacted by phone or by letter within the next 1-3 weeks.  Please call us at [phone] if you have not heard about the biopsies in 3 weeks.    SIGNATURES/CONFIDENTIALITY: You and/or your care partner have signed paperwork which will be entered into your electronic medical record.  These signatures attest to the fact that that the information above on your After Visit Summary has been reviewed and is understood.  Full responsibility of the confidentiality of this discharge information lies with you and/or your care-partner. 

## 2014-09-08 NOTE — Progress Notes (Signed)
To recovery, report to McCoy, RN, VSS 

## 2014-09-08 NOTE — Op Note (Signed)
[facility]  [address] [place], [address]   COLONOSCOPY PROCEDURE REPORT  PATIENT: Heidi, Crawford  MR#: [id number] BIRTHDATE: Jul 22, 1960 , 66  yrs. old GENDER: female ENDOSCOPIST: Eustace Quail, MD REFERRED EX:[id number] Raymon Mutton, M.D. PROCEDURE DATE:  09/08/2014 PROCEDURE:   Colonoscopy, screening First Screening Colonoscopy - Avg.  risk and is 50 yrs.  old or older Yes.  Prior Negative Screening - Now for repeat screening. N/A  History of Adenoma - Now for follow-up colonoscopy & has been > or = to 3 yrs.  N/A  Polyps Removed Today? No.  Recommend repeat exam, <10 yrs? No. ASA CLASS:   Class II INDICATIONS:average risk patient for colorectal cancer. MEDICATIONS: Monitored anesthesia care and Propofol 350 mg IV  DESCRIPTION OF PROCEDURE:   After the risks benefits and alternatives of the procedure were thoroughly explained, informed consent was obtained.  The digital rectal exam revealed no abnormalities of the rectum.   The LB [id number]  endoscope was introduced through the anus and advanced to the cecum, which was identified by both the appendix and ileocecal valve. No adverse events experienced.   The quality of the prep was excellent, using MoviPrep  The instrument was then slowly withdrawn as the colon was fully examined.     COLON FINDINGS: A normal appearing cecum, ileocecal valve, and appendiceal orifice were identified.  The ascending, transverse, descending, sigmoid colon, and rectum appeared unremarkable. Retroflexed views revealed no abnormalities. The time to cecum=5 minutes 53 seconds.  Withdrawal time=8 minutes 45 seconds.  The scope was withdrawn and the procedure completed. COMPLICATIONS: There were no immediate complications.  ENDOSCOPIC IMPRESSION: 1. Normal colonoscopy  RECOMMENDATIONS: 1. Continue current colorectal screening recommendations for "routine risk" patients with a repeat colonoscopy in 10 years.  eSigned:  Eustace Quail, MD 09/08/2014 10:16 AM   cc: Laurey Morale, MD and The Patient

## 2014-09-11 ENCOUNTER — Telehealth: Payer: Self-pay

## 2014-09-11 NOTE — Telephone Encounter (Signed)
Left message on answering machine. 

## 2015-02-27 ENCOUNTER — Ambulatory Visit (INDEPENDENT_AMBULATORY_CARE_PROVIDER_SITE_OTHER): Payer: BLUE CROSS/BLUE SHIELD | Admitting: Family Medicine

## 2015-02-27 ENCOUNTER — Encounter: Payer: Self-pay | Admitting: Family Medicine

## 2015-02-27 VITALS — BP 130/74 | HR 88 | Temp 98.3°F | Ht 62.0 in | Wt 195.0 lb

## 2015-02-27 DIAGNOSIS — J019 Acute sinusitis, unspecified: Secondary | ICD-10-CM

## 2015-02-27 MED ORDER — AZITHROMYCIN 250 MG PO TABS: ORAL_TABLET | ORAL | Status: DC

## 2015-02-27 NOTE — Progress Notes (Signed)
Pre visit review using our clinic review tool, if applicable. No additional management support is needed unless otherwise documented below in the visit note. 

## 2015-02-27 NOTE — Progress Notes (Signed)
   Subjective:    Patient ID: Heidi Crawford, female    DOB: 04-26-1961, 54 y.o.   MRN: [id number]  HPI Here for one week of sinus pressure, HA, PND,and a dry cough. On Mucinex.    Review of Systems  Constitutional: Negative.   HENT: Positive for congestion, postnasal drip and sinus pressure.   Eyes: Negative.   Respiratory: Positive for cough.        Objective:   Physical Exam  Constitutional: She appears well-developed and well-nourished.  HENT:  Right Ear: External ear normal.  Left Ear: External ear normal.  Nose: Nose normal.  Mouth/Throat: Oropharynx is clear and moist.  Eyes: Conjunctivae are normal.  Neck: No thyromegaly present.  Cardiovascular: Normal rate, regular rhythm, normal heart sounds and intact distal pulses.   Pulmonary/Chest: Effort normal and breath sounds normal.  Lymphadenopathy:    She has no cervical adenopathy.          Assessment & Plan:  Sinusitis. Treat with a Zpack

## 2015-08-08 ENCOUNTER — Other Ambulatory Visit: Payer: Self-pay | Admitting: Family Medicine

## 2015-08-14 ENCOUNTER — Ambulatory Visit: Payer: BLUE CROSS/BLUE SHIELD | Admitting: Family Medicine

## 2015-08-31 ENCOUNTER — Other Ambulatory Visit: Payer: Self-pay | Admitting: Family Medicine

## 2015-09-05 ENCOUNTER — Ambulatory Visit: Payer: BLUE CROSS/BLUE SHIELD | Admitting: Family Medicine

## 2015-09-11 ENCOUNTER — Encounter: Payer: Self-pay | Admitting: Family Medicine

## 2015-09-11 ENCOUNTER — Ambulatory Visit (INDEPENDENT_AMBULATORY_CARE_PROVIDER_SITE_OTHER): Payer: BLUE CROSS/BLUE SHIELD | Admitting: Family Medicine

## 2015-09-11 VITALS — BP 111/84 | HR 99 | Temp 98.7°F | Ht 62.0 in | Wt 193.0 lb

## 2015-09-11 DIAGNOSIS — E785 Hyperlipidemia, unspecified: Secondary | ICD-10-CM

## 2015-09-11 DIAGNOSIS — I10 Essential (primary) hypertension: Secondary | ICD-10-CM | POA: Diagnosis not present

## 2015-09-11 DIAGNOSIS — M7551 Bursitis of right shoulder: Secondary | ICD-10-CM

## 2015-09-11 LAB — HEPATIC FUNCTION PANEL
ALT: 15 U/L (ref 0–35)
AST: 15 U/L (ref 0–37)
Albumin: 4.4 g/dL (ref 3.5–5.2)
Alkaline Phosphatase: 129 U/L — ABNORMAL HIGH (ref 39–117)
BILIRUBIN DIRECT: 0.1 mg/dL (ref 0.0–0.3)
BILIRUBIN TOTAL: 0.5 mg/dL (ref 0.2–1.2)
Total Protein: 7 g/dL (ref 6.0–8.3)

## 2015-09-11 LAB — CBC WITH DIFFERENTIAL/PLATELET
BASOS ABS: 0 10*3/uL (ref 0.0–0.1)
Basophils Relative: 0.5 % (ref 0.0–3.0)
EOS ABS: 0.3 10*3/uL (ref 0.0–0.7)
EOS PCT: 4.1 % (ref 0.0–5.0)
HEMATOCRIT: 43.4 % (ref 36.0–46.0)
Hemoglobin: 14.7 g/dL (ref 12.0–15.0)
LYMPHS ABS: 1.2 10*3/uL (ref 0.7–4.0)
LYMPHS PCT: 19.7 % (ref 12.0–46.0)
MCHC: 34 g/dL (ref 30.0–36.0)
MCV: 88.3 fl (ref 78.0–100.0)
MONO ABS: 0.4 10*3/uL (ref 0.1–1.0)
MONOS PCT: 6 % (ref 3.0–12.0)
NEUTROS PCT: 69.7 % (ref 43.0–77.0)
Neutro Abs: 4.3 10*3/uL (ref 1.4–7.7)
PLATELETS: 282 10*3/uL (ref 150.0–400.0)
RBC: 4.91 Mil/uL (ref 3.87–5.11)
RDW: 13.1 % (ref 11.5–15.5)
WBC: 6.2 10*3/uL (ref 4.0–10.5)

## 2015-09-11 LAB — BASIC METABOLIC PANEL
BUN: 17 mg/dL (ref 6–23)
CALCIUM: 10 mg/dL (ref 8.4–10.5)
CHLORIDE: 103 meq/L (ref 96–112)
CO2: 27 mEq/L (ref 19–32)
CREATININE: 0.85 mg/dL (ref 0.40–1.20)
GFR: 73.83 mL/min (ref >60.00)
Glucose, Bld: 86 mg/dL (ref 70–99)
Potassium: 4.1 mEq/L (ref 3.5–5.1)
Sodium: 142 mEq/L (ref 135–145)

## 2015-09-11 LAB — LIPID PANEL
Cholesterol: 147 mg/dL (ref 0–200)
HDL: 61.8 mg/dL (ref >39.00)
LDL Cholesterol: 73 mg/dL (ref 0–99)
NonHDL: 85.48
TRIGLYCERIDES: 63 mg/dL (ref 0.0–149.0)
Total CHOL/HDL Ratio: 2
VLDL: 12.6 mg/dL (ref 0.0–40.0)

## 2015-09-11 LAB — POC URINALSYSI DIPSTICK (AUTOMATED)
Bilirubin, UA: NEGATIVE
Blood, UA: NEGATIVE
Glucose, UA: NEGATIVE
Ketones, UA: NEGATIVE
LEUKOCYTES UA: NEGATIVE
Nitrite, UA: NEGATIVE
PROTEIN UA: NEGATIVE
Spec Grav, UA: 1.015
UROBILINOGEN UA: 0.2
pH, UA: 5.5

## 2015-09-11 LAB — TSH: TSH: 0.85 u[IU]/mL (ref 0.35–4.50)

## 2015-09-11 MED ORDER — VALSARTAN 320 MG PO TABS: ORAL_TABLET | ORAL | Status: DC

## 2015-09-11 MED ORDER — FUROSEMIDE 40 MG PO TABS: ORAL_TABLET | ORAL | Status: DC

## 2015-09-11 MED ORDER — SIMVASTATIN 40 MG PO TABS: ORAL_TABLET | ORAL | Status: DC

## 2015-09-11 NOTE — Progress Notes (Signed)
Pre visit review using our clinic review tool, if applicable. No additional management support is needed unless otherwise documented below in the visit note. 

## 2015-09-11 NOTE — Progress Notes (Signed)
   Subjective:    Patient ID: Heidi Crawford, female    DOB: [birth year], 55 y.o.   MRN: [id number]  HPI Here to follow up on HTN and lipids. She has been doing well and her BP is stable at home. She is fasting today to have labs drawn. She also asks me to check her right shoulder, which has been painful for 3 weeks. No hx of trauma. The pain is located directly beneath the deltoid area. She mostly has trouble moving the right arm behind her back. She has only tried heat so far.    Review of Systems  Constitutional: Negative.   Respiratory: Negative.   Cardiovascular: Negative.   Musculoskeletal: Positive for arthralgias.  Neurological: Negative.        Objective:   Physical Exam  Constitutional: She is oriented to person, place, and time. She appears well-developed and well-nourished.  Neck: No thyromegaly present.  Cardiovascular: Normal rate, regular rhythm, normal heart sounds and intact distal pulses.   Pulmonary/Chest: Effort normal and breath sounds normal.  Musculoskeletal:  The right upper arm is tender over the deltoid bursa. The shoulder has full ROM but extremes of int/ext rotation are painful. No crepitus   Lymphadenopathy:    She has no cervical adenopathy.  Neurological: She is alert and oriented to person, place, and time.          Assessment & Plan:  Her HTN is stable. Check her lipids with the lab draw today. For the bursitis she will take 2 Aleve tabs bid for a week or two. Recheck prn

## 2015-10-24 ENCOUNTER — Encounter: Payer: Self-pay | Admitting: Family Medicine

## 2015-10-24 DIAGNOSIS — M25511 Pain in right shoulder: Secondary | ICD-10-CM

## 2015-10-24 NOTE — Telephone Encounter (Signed)
I spoke with pt and she would like the referral.  

## 2015-10-24 NOTE — Telephone Encounter (Signed)
I would suggest she see an Orthopedist for the shoulder. Is there someone she would like me to refer her to?

## 2015-10-25 NOTE — Telephone Encounter (Signed)
Referral was done  

## 2016-02-19 ENCOUNTER — Encounter: Payer: Self-pay | Admitting: Family Medicine

## 2016-02-25 ENCOUNTER — Encounter: Payer: Self-pay | Admitting: Family Medicine

## 2016-02-25 NOTE — Telephone Encounter (Signed)
I agree. Tell her to cut the Diovan tablet in half and take 1/2 tab a day for 2 weeks. See what the readings do

## 2016-08-08 ENCOUNTER — Ambulatory Visit (INDEPENDENT_AMBULATORY_CARE_PROVIDER_SITE_OTHER): Payer: BLUE CROSS/BLUE SHIELD | Admitting: Family Medicine

## 2016-08-08 ENCOUNTER — Encounter: Payer: Self-pay | Admitting: Family Medicine

## 2016-08-08 VITALS — BP 142/88 | HR 71 | Temp 98.1°F | Ht 62.0 in | Wt 162.0 lb

## 2016-08-08 DIAGNOSIS — J209 Acute bronchitis, unspecified: Secondary | ICD-10-CM

## 2016-08-08 MED ORDER — AZITHROMYCIN 250 MG PO TABS
ORAL_TABLET | ORAL | 0 refills | Status: DC
Start: 2016-08-08 — End: 2016-11-20

## 2016-08-08 MED ORDER — VALSARTAN 80 MG PO TABS: 80.0000 mg | ORAL_TABLET | Freq: Every day | ORAL | 3 refills | Status: DC

## 2016-08-08 NOTE — Progress Notes (Signed)
   Subjective:    Patient ID: Heidi Crawford, female    DOB: 06-Sep-1960, 56 y.o.   MRN: [id number]  HPI Here for 4 days of PND, sinus congestion, and coughing up yellow sputum. No fever.    Review of Systems  Constitutional: Negative.   HENT: Positive for congestion, postnasal drip and sinus pressure. Negative for sinus pain and sore throat.   Eyes: Negative.   Respiratory: Positive for cough.        Objective:   Physical Exam  Constitutional: She appears well-developed and well-nourished.  HENT:  Right Ear: External ear normal.  Left Ear: External ear normal.  Nose: Nose normal.  Mouth/Throat: Oropharynx is clear and moist.  Eyes: Conjunctivae are normal.  Pulmonary/Chest: Effort normal. No respiratory distress. She has no wheezes. She has no rales.  Scattered rhonchi   Lymphadenopathy:    She has no cervical adenopathy.          Assessment & Plan:  Bronchitis, treat with a Zpack.  Alysia Penna, MD

## 2016-09-28 ENCOUNTER — Other Ambulatory Visit: Payer: Self-pay | Admitting: Family Medicine

## 2016-10-15 ENCOUNTER — Other Ambulatory Visit: Payer: Self-pay | Admitting: Family Medicine

## 2016-11-03 ENCOUNTER — Other Ambulatory Visit: Payer: Self-pay | Admitting: Family Medicine

## 2016-11-11 ENCOUNTER — Encounter: Payer: Self-pay | Admitting: Family Medicine

## 2016-11-20 ENCOUNTER — Ambulatory Visit (INDEPENDENT_AMBULATORY_CARE_PROVIDER_SITE_OTHER): Payer: BLUE CROSS/BLUE SHIELD | Admitting: Family Medicine

## 2016-11-20 ENCOUNTER — Encounter: Payer: Self-pay | Admitting: Family Medicine

## 2016-11-20 VITALS — BP 99/74 | HR 85 | Temp 98.3°F | Ht 62.0 in | Wt 168.0 lb

## 2016-11-20 DIAGNOSIS — Z0000 Encounter for general adult medical examination without abnormal findings: Secondary | ICD-10-CM

## 2016-11-20 LAB — POC URINALSYSI DIPSTICK (AUTOMATED)
Bilirubin, UA: NEGATIVE
Blood, UA: NEGATIVE
Glucose, UA: NEGATIVE
Ketones, UA: NEGATIVE
Leukocytes, UA: NEGATIVE
Nitrite, UA: NEGATIVE
Protein, UA: NEGATIVE
Spec Grav, UA: 1.02
Urobilinogen, UA: 0.2 U/dL
pH, UA: 6

## 2016-11-20 LAB — CBC WITH DIFFERENTIAL/PLATELET
BASOS ABS: 0.1 10*3/uL (ref 0.0–0.1)
Basophils Relative: 1.2 % (ref 0.0–3.0)
Eosinophils Absolute: 0.3 10*3/uL (ref 0.0–0.7)
Eosinophils Relative: 5.8 % — ABNORMAL HIGH (ref 0.0–5.0)
HEMATOCRIT: 42.3 % (ref 36.0–46.0)
Hemoglobin: 14.2 g/dL (ref 12.0–15.0)
LYMPHS PCT: 20.3 % (ref 12.0–46.0)
Lymphs Abs: 0.9 10*3/uL (ref 0.7–4.0)
MCHC: 33.7 g/dL (ref 30.0–36.0)
MCV: 89.5 fl (ref 78.0–100.0)
Monocytes Absolute: 0.4 10*3/uL (ref 0.1–1.0)
Monocytes Relative: 8.5 % (ref 3.0–12.0)
NEUTROS ABS: 2.8 10*3/uL (ref 1.4–7.7)
Neutrophils Relative %: 64.2 % (ref 43.0–77.0)
PLATELETS: 254 10*3/uL (ref 150.0–400.0)
RBC: 4.72 Mil/uL (ref 3.87–5.11)
RDW: 12.4 % (ref 11.5–15.5)
WBC: 4.4 10*3/uL (ref 4.0–10.5)

## 2016-11-20 LAB — HEPATIC FUNCTION PANEL
ALBUMIN: 4.4 g/dL (ref 3.5–5.2)
ALT: 14 U/L (ref 0–35)
AST: 15 U/L (ref 0–37)
Alkaline Phosphatase: 114 U/L (ref 39–117)
BILIRUBIN TOTAL: 0.4 mg/dL (ref 0.2–1.2)
Bilirubin, Direct: 0.1 mg/dL (ref 0.0–0.3)
Total Protein: 6.9 g/dL (ref 6.0–8.3)

## 2016-11-20 LAB — LIPID PANEL
CHOL/HDL RATIO: 3
Cholesterol: 164 mg/dL (ref 0–200)
HDL: 65.2 mg/dL (ref >39.00)
LDL CALC: 85 mg/dL (ref 0–99)
NonHDL: 98.7
Triglycerides: 68 mg/dL (ref 0.0–149.0)
VLDL: 13.6 mg/dL (ref 0.0–40.0)

## 2016-11-20 LAB — BASIC METABOLIC PANEL
BUN: 16 mg/dL (ref 6–23)
CALCIUM: 9.8 mg/dL (ref 8.4–10.5)
CO2: 33 mEq/L — ABNORMAL HIGH (ref 19–32)
Chloride: 103 mEq/L (ref 96–112)
Creatinine, Ser: 0.79 mg/dL (ref 0.40–1.20)
GFR: 79.99 mL/min (ref >60.00)
GLUCOSE: 87 mg/dL (ref 70–99)
POTASSIUM: 4 meq/L (ref 3.5–5.1)
Sodium: 142 mEq/L (ref 135–145)

## 2016-11-20 LAB — TSH: TSH: 1.11 u[IU]/mL (ref 0.35–4.50)

## 2016-11-20 MED ORDER — FUROSEMIDE 40 MG PO TABS: 40.0000 mg | ORAL_TABLET | Freq: Every day | ORAL | 3 refills | Status: DC

## 2016-11-20 NOTE — Patient Instructions (Signed)
WE NOW OFFER   [facility] FAST TRACK!!!  SAME DAY Appointments for ACUTE CARE  Such as: Sprains, Injuries, cuts, abrasions, rashes, muscle pain, joint pain, back pain Colds, flu, sore throats, headache, allergies, cough, fever  Ear pain, sinus and eye infections Abdominal pain, nausea, vomiting, diarrhea, upset stomach Animal/insect bites  3 Easy Ways to Schedule: Walk-In Scheduling Call in scheduling Mychart Sign-up: [email or web address]         

## 2016-11-20 NOTE — Progress Notes (Signed)
   Subjective:    Patient ID: Heidi Crawford, female    DOB: [birth year], 56 y.o.   MRN: [id number]  HPI 55 yr old female for a well exam. She feels fine and her BP is stable at home.    Review of Systems  Constitutional: Negative.   HENT: Negative.   Eyes: Negative.   Respiratory: Negative.   Cardiovascular: Negative.   Gastrointestinal: Negative.   Genitourinary: Negative for decreased urine volume, difficulty urinating, dyspareunia, dysuria, enuresis, flank pain, frequency, hematuria, pelvic pain and urgency.  Musculoskeletal: Negative.   Skin: Negative.   Neurological: Negative.   Psychiatric/Behavioral: Negative.        Objective:   Physical Exam  Constitutional: She is oriented to person, place, and time. She appears well-developed and well-nourished. No distress.  HENT:  Head: Normocephalic and atraumatic.  Right Ear: External ear normal.  Left Ear: External ear normal.  Nose: Nose normal.  Mouth/Throat: Oropharynx is clear and moist. No oropharyngeal exudate.  Eyes: Conjunctivae and EOM are normal. Pupils are equal, round, and reactive to light. No scleral icterus.  Neck: Normal range of motion. Neck supple. No JVD present. No thyromegaly present.  Cardiovascular: Normal rate, regular rhythm, normal heart sounds and intact distal pulses.  Exam reveals no gallop and no friction rub.   No murmur heard. Pulmonary/Chest: Effort normal and breath sounds normal. No respiratory distress. She has no wheezes. She has no rales. She exhibits no tenderness.  Abdominal: Soft. Bowel sounds are normal. She exhibits no distension and no mass. There is no tenderness. There is no rebound and no guarding.  Musculoskeletal: Normal range of motion. She exhibits no edema or tenderness.  Lymphadenopathy:    She has no cervical adenopathy.  Neurological: She is alert and oriented to person, place, and time. She has normal reflexes. No cranial nerve deficit. She exhibits normal muscle tone.  Coordination normal.  Skin: Skin is warm and dry. No rash noted. No erythema.  Psychiatric: She has a normal mood and affect. Her behavior is normal. Judgment and thought content normal.          Assessment & Plan:  Well exam. We discussed diet and exercise. Get labs today.  Alysia Penna, MD

## 2016-12-17 ENCOUNTER — Encounter: Payer: Self-pay | Admitting: Family Medicine

## 2016-12-17 NOTE — Telephone Encounter (Signed)
Her lipids look great so we can reduce her dosage of Simvastatin to 20 mg daily. Send in #90 with 3 rf

## 2016-12-19 ENCOUNTER — Other Ambulatory Visit: Payer: Self-pay | Admitting: Family Medicine

## 2016-12-19 MED ORDER — SIMVASTATIN 20 MG PO TABS: 20.0000 mg | ORAL_TABLET | Freq: Every day | ORAL | 3 refills | Status: DC

## 2016-12-19 NOTE — Telephone Encounter (Signed)
I spoke with pt and sent new script e-scribe to [facility]. Also removed Zocor 40 mg from current medication list.

## 2017-01-20 ENCOUNTER — Encounter: Payer: Self-pay | Admitting: Family Medicine

## 2017-01-21 ENCOUNTER — Encounter: Payer: Self-pay | Admitting: Family Medicine

## 2017-01-21 ENCOUNTER — Other Ambulatory Visit: Payer: Self-pay | Admitting: Family Medicine

## 2017-01-21 MED ORDER — VALSARTAN 80 MG PO TABS: 80.0000 mg | ORAL_TABLET | Freq: Every day | ORAL | 0 refills | Status: DC

## 2017-03-26 ENCOUNTER — Encounter: Payer: Self-pay | Admitting: Family Medicine

## 2017-04-18 ENCOUNTER — Other Ambulatory Visit: Payer: Self-pay | Admitting: Family Medicine

## 2017-04-22 DIAGNOSIS — L570 Actinic keratosis: Secondary | ICD-10-CM | POA: Diagnosis not present

## 2017-04-22 DIAGNOSIS — L738 Other specified follicular disorders: Secondary | ICD-10-CM | POA: Diagnosis not present

## 2017-06-18 ENCOUNTER — Other Ambulatory Visit: Payer: Self-pay | Admitting: Family Medicine

## 2017-06-20 DIAGNOSIS — L570 Actinic keratosis: Secondary | ICD-10-CM | POA: Diagnosis not present

## 2017-07-01 DIAGNOSIS — Z1389 Encounter for screening for other disorder: Secondary | ICD-10-CM | POA: Diagnosis not present

## 2017-07-01 DIAGNOSIS — Z6832 Body mass index (BMI) 32.0-32.9, adult: Secondary | ICD-10-CM | POA: Diagnosis not present

## 2017-07-01 DIAGNOSIS — Z01419 Encounter for gynecological examination (general) (routine) without abnormal findings: Secondary | ICD-10-CM | POA: Diagnosis not present

## 2017-07-01 DIAGNOSIS — Z1231 Encounter for screening mammogram for malignant neoplasm of breast: Secondary | ICD-10-CM | POA: Diagnosis not present

## 2017-07-01 DIAGNOSIS — Z130 Encounter for screening for diseases of the blood and blood-forming organs and certain disorders involving the immune mechanism: Secondary | ICD-10-CM | POA: Diagnosis not present

## 2017-08-03 DIAGNOSIS — L578 Other skin changes due to chronic exposure to nonionizing radiation: Secondary | ICD-10-CM | POA: Diagnosis not present

## 2017-08-11 DIAGNOSIS — L2089 Other atopic dermatitis: Secondary | ICD-10-CM | POA: Diagnosis not present

## 2017-09-28 DIAGNOSIS — D2239 Melanocytic nevi of other parts of face: Secondary | ICD-10-CM | POA: Diagnosis not present

## 2017-09-28 DIAGNOSIS — D485 Neoplasm of uncertain behavior of skin: Secondary | ICD-10-CM | POA: Diagnosis not present

## 2017-11-11 ENCOUNTER — Other Ambulatory Visit: Payer: Self-pay

## 2017-11-11 ENCOUNTER — Encounter: Payer: Self-pay | Admitting: Family Medicine

## 2017-11-11 MED ORDER — VALSARTAN 80 MG PO TABS: 80.0000 mg | ORAL_TABLET | Freq: Every day | ORAL | 0 refills | Status: DC

## 2017-11-13 ENCOUNTER — Ambulatory Visit (INDEPENDENT_AMBULATORY_CARE_PROVIDER_SITE_OTHER): Payer: BLUE CROSS/BLUE SHIELD | Admitting: Family Medicine

## 2017-11-13 ENCOUNTER — Encounter: Payer: Self-pay | Admitting: Family Medicine

## 2017-11-13 VITALS — BP 122/84 | HR 73 | Temp 98.2°F | Resp 16 | Ht 62.0 in | Wt 183.6 lb

## 2017-11-13 DIAGNOSIS — Z0000 Encounter for general adult medical examination without abnormal findings: Secondary | ICD-10-CM | POA: Diagnosis not present

## 2017-11-13 LAB — POC URINALSYSI DIPSTICK (AUTOMATED)
BILIRUBIN UA: NEGATIVE
GLUCOSE UA: NEGATIVE
KETONES UA: NEGATIVE
Leukocytes, UA: NEGATIVE
NITRITE UA: NEGATIVE
Protein, UA: NEGATIVE
RBC UA: NEGATIVE
Spec Grav, UA: 1.025 (ref 1.010–1.025)
Urobilinogen, UA: 0.2 E.U./dL
pH, UA: 6 (ref 5.0–8.0)

## 2017-11-13 LAB — LIPID PANEL
CHOLESTEROL: 171 mg/dL (ref 0–200)
HDL: 60.9 mg/dL (ref >39.00)
LDL Cholesterol: 96 mg/dL (ref 0–99)
NonHDL: 110.27
TRIGLYCERIDES: 69 mg/dL (ref 0.0–149.0)
Total CHOL/HDL Ratio: 3
VLDL: 13.8 mg/dL (ref 0.0–40.0)

## 2017-11-13 LAB — CBC WITH DIFFERENTIAL/PLATELET
BASOS PCT: 1.1 % (ref 0.0–3.0)
Basophils Absolute: 0.1 10*3/uL (ref 0.0–0.1)
EOS PCT: 3.5 % (ref 0.0–5.0)
Eosinophils Absolute: 0.2 10*3/uL (ref 0.0–0.7)
HEMATOCRIT: 41.2 % (ref 36.0–46.0)
HEMOGLOBIN: 14.4 g/dL (ref 12.0–15.0)
LYMPHS PCT: 23.8 % (ref 12.0–46.0)
Lymphs Abs: 1.2 10*3/uL (ref 0.7–4.0)
MCHC: 34.9 g/dL (ref 30.0–36.0)
MCV: 88.2 fl (ref 78.0–100.0)
Monocytes Absolute: 0.3 10*3/uL (ref 0.1–1.0)
Monocytes Relative: 6.2 % (ref 3.0–12.0)
Neutro Abs: 3.2 10*3/uL (ref 1.4–7.7)
Neutrophils Relative %: 65.4 % (ref 43.0–77.0)
Platelets: 274 10*3/uL (ref 150.0–400.0)
RBC: 4.67 Mil/uL (ref 3.87–5.11)
RDW: 12.5 % (ref 11.5–15.5)
WBC: 4.9 10*3/uL (ref 4.0–10.5)

## 2017-11-13 LAB — BASIC METABOLIC PANEL
BUN: 14 mg/dL (ref 6–23)
CHLORIDE: 103 meq/L (ref 96–112)
CO2: 31 meq/L (ref 19–32)
Calcium: 9.5 mg/dL (ref 8.4–10.5)
Creatinine, Ser: 0.75 mg/dL (ref 0.40–1.20)
GFR: 84.63 mL/min (ref >60.00)
GLUCOSE: 85 mg/dL (ref 70–99)
POTASSIUM: 3.7 meq/L (ref 3.5–5.1)
SODIUM: 143 meq/L (ref 135–145)

## 2017-11-13 LAB — HEPATIC FUNCTION PANEL
ALBUMIN: 4.2 g/dL (ref 3.5–5.2)
ALK PHOS: 104 U/L (ref 39–117)
ALT: 10 U/L (ref 0–35)
AST: 12 U/L (ref 0–37)
Bilirubin, Direct: 0.1 mg/dL (ref 0.0–0.3)
TOTAL PROTEIN: 6.8 g/dL (ref 6.0–8.3)
Total Bilirubin: 0.6 mg/dL (ref 0.2–1.2)

## 2017-11-13 LAB — TSH: TSH: 1.12 u[IU]/mL (ref 0.35–4.50)

## 2017-11-13 MED ORDER — FUROSEMIDE 40 MG PO TABS: ORAL_TABLET | ORAL | 3 refills | Status: DC

## 2017-11-13 MED ORDER — VALSARTAN 80 MG PO TABS: 80.0000 mg | ORAL_TABLET | Freq: Every day | ORAL | 3 refills | Status: DC

## 2017-11-13 MED ORDER — SIMVASTATIN 20 MG PO TABS: 20.0000 mg | ORAL_TABLET | Freq: Every day | ORAL | 3 refills | Status: DC

## 2017-11-13 NOTE — Progress Notes (Signed)
   Subjective:    Patient ID: Heidi Crawford, female    DOB: December 10, 1960, 57 y.o.   MRN: [id number]  HPI Here for a well exam. She feels well. She has gained some weight but she plans to go back to [employer].    Review of Systems  Constitutional: Negative.   HENT: Negative.   Eyes: Negative.   Respiratory: Negative.   Cardiovascular: Negative.   Gastrointestinal: Negative.   Genitourinary: Negative for decreased urine volume, difficulty urinating, dyspareunia, dysuria, enuresis, flank pain, frequency, hematuria, pelvic pain and urgency.  Musculoskeletal: Negative.   Skin: Negative.   Neurological: Negative.   Psychiatric/Behavioral: Negative.        Objective:   Physical Exam  Constitutional: She is oriented to person, place, and time. She appears well-developed and well-nourished. No distress.  HENT:  Head: Normocephalic and atraumatic.  Right Ear: External ear normal.  Left Ear: External ear normal.  Nose: Nose normal.  Mouth/Throat: Oropharynx is clear and moist. No oropharyngeal exudate.  Eyes: Pupils are equal, round, and reactive to light. Conjunctivae and EOM are normal. No scleral icterus.  Neck: Normal range of motion. Neck supple. No JVD present. No thyromegaly present.  Cardiovascular: Normal rate, regular rhythm, normal heart sounds and intact distal pulses. Exam reveals no gallop and no friction rub.  No murmur heard. Pulmonary/Chest: Effort normal and breath sounds normal. No respiratory distress. She has no wheezes. She has no rales. She exhibits no tenderness.  Abdominal: Soft. Bowel sounds are normal. She exhibits no distension and no mass. There is no tenderness. There is no rebound and no guarding.  Musculoskeletal: Normal range of motion. She exhibits no edema or tenderness.  Lymphadenopathy:    She has no cervical adenopathy.  Neurological: She is alert and oriented to person, place, and time. She has normal reflexes. She displays normal reflexes. No  cranial nerve deficit. She exhibits normal muscle tone. Coordination normal.  Skin: Skin is warm and dry. No rash noted. No erythema.  Psychiatric: She has a normal mood and affect. Her behavior is normal. Judgment and thought content normal.          Assessment & Plan:  Well exam. We discussed diet and exercise. Get fasting labs. Alysia Penna, MD

## 2018-04-14 DIAGNOSIS — Z23 Encounter for immunization: Secondary | ICD-10-CM | POA: Diagnosis not present

## 2018-06-12 ENCOUNTER — Other Ambulatory Visit: Payer: Self-pay

## 2018-06-12 ENCOUNTER — Emergency Department (INDEPENDENT_AMBULATORY_CARE_PROVIDER_SITE_OTHER)
Admission: EM | Admit: 2018-06-12 | Discharge: 2018-06-12 | Disposition: A | Discharge status: Home / Self Care | Payer: BLUE CROSS/BLUE SHIELD | Source: Home / Self Care | Attending: Family Medicine | Admitting: Family Medicine

## 2018-06-12 DIAGNOSIS — L500 Allergic urticaria: Secondary | ICD-10-CM | POA: Diagnosis not present

## 2018-06-12 MED ORDER — PREDNISONE 20 MG PO TABS: ORAL_TABLET | ORAL | 0 refills | Status: DC

## 2018-06-12 MED ORDER — METHYLPREDNISOLONE SODIUM SUCC 125 MG IJ SOLR
80.0000 mg | Freq: Once | INTRAMUSCULAR | Status: AC
  Administered 2018-06-12: 80 mg via INTRAMUSCULAR

## 2018-06-12 NOTE — ED Provider Notes (Signed)
[facility]    CSN: [id number] Arrival date & time: 06/12/18  0910     History   Chief Complaint Chief Complaint  Patient presents with  . Rash    HPI Heidi Crawford is a 57 y.o. female.   Patient reports that she developed itching and rash on her left forearm yesterday morning.  The rash then spread to her chest, neck and arms, but not lower body.  The lesions are slightly raised and migratory.  She feels well otherwise.  No shortness of breath, wheezing, or difficulty swallowing.  She has had mild improvement in itching with chlorpheniramine at bedtime.  She reports that she had finished up a 12 day course of penicillin 3 days ago for dental infection but had no adverse effects while taking the penicillin.  The history is provided by the patient.  Rash  Location: neck, chest, arms. Quality: dryness, itchiness, redness and swelling   Quality: not blistering, not bruising, not burning, not painful, not scaling and not weeping   Severity:  Moderate Onset quality:  Sudden Duration:  1 day Timing:  Constant Progression:  Worsening Chronicity:  New Context: medications   Context: not animal contact, not chemical exposure, not exposure to similar rash, not food, not hot tub use, not insect bite/sting, not new detergent/soap, not nuts and not plant contact   Relieved by:  Nothing Worsened by:  Nothing Ineffective treatments:  Antihistamines Associated symptoms: no abdominal pain, no diarrhea, no fatigue, no fever, no headaches, no hoarse voice, no induration, no joint pain, no myalgias, no nausea, no periorbital edema, no shortness of breath, no sore throat, no throat swelling, no tongue swelling and not wheezing     Past Medical History:  Diagnosis Date  . Allergy   . Dependent edema   . GERD (gastroesophageal reflux disease)   . Headache(784.0)   . Hyperlipidemia   . Hypertension   . 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 TMJ pain dysfunction  syndrome     Patient Active Problem List   Diagnosis Date Noted  . Hyperlipidemia 05/04/2012  . Essential hypertension 03/05/2009  . COUGH 12/27/2007  . ALLERGIC RHINITIS 12/03/2007  . DEPENDENT EDEMA 11/10/2007  . ACUTE SINUSITIS, UNSPECIFIED 09/09/2007  . TMJ PAIN 03/04/2007  . HEADACHE 03/04/2007    Past Surgical History:  Procedure Laterality Date  . COLONOSCOPY  09/08/2014   per Dr. Henrene Pastor, clear, repeat in 10 yrs   . DENTAL SURGERY    . removal skin cancer  2014   from right arm per Dr. Delman Cheadle     OB History   None      Home Medications    Prior to Admission medications   Medication Sig Start Date End Date Taking? Authorizing Provider  chlorpheniramine (CHLOR-TRIMETON) 4 MG tablet Take 4 mg by mouth at bedtime.    [provider]  furosemide (LASIX) 40 MG tablet TAKE 1 TABLET DAILY 11/13/17   Laurey Morale, MD  predniSONE (DELTASONE) 20 MG tablet Take one tab by mouth twice daily for 3 days, then one daily for 2 days. Take with food. 06/12/18   Kandra Nicolas, MD  simvastatin (ZOCOR) 20 MG tablet Take 1 tablet (20 mg total) by mouth at bedtime. 11/13/17   Laurey Morale, MD  tacrolimus (PROTOPIC) 0.1 % ointment Apply 1 application topically daily. 08/11/17   [provider]  valsartan (DIOVAN) 80 MG tablet Take 1 tablet (80 mg  total) by mouth daily. 11/13/17   Laurey Morale, MD    Family History Family History  Problem Relation Age of Onset  . Alcohol abuse Unknown        fhx  . Arthritis Unknown        fhx  . Hyperlipidemia Unknown        fhx  . Hypertension Unknown        fhx  . Heart disease Unknown        fhx  . Stroke Unknown        fhx  . Hypertension Mother   . Hypertension Father   . Colon cancer Neg Hx   . Rectal cancer Neg Hx   . Stomach cancer Neg Hx     Social History Social History   Tobacco Use  . Smoking status: Former Smoker    Last attempt to quit: 07/07/2000    Years since quitting: 17.9  . Smokeless tobacco:  Never Used  Substance Use Topics  . Alcohol use: No    Alcohol/week: 0.0 standard drinks  . Drug use: No     Allergies   Amoxicillin-pot clavulanate   Review of Systems Review of Systems  Constitutional: Negative for fatigue and fever.  HENT: Negative for hoarse voice and sore throat.   Respiratory: Negative for shortness of breath and wheezing.   Gastrointestinal: Negative for abdominal pain, diarrhea and nausea.  Musculoskeletal: Negative for arthralgias and myalgias.  Skin: Positive for rash.  Neurological: Negative for headaches.  All other systems reviewed and are negative.    Physical Exam Triage Vital Signs ED Triage Vitals  Enc Vitals Group     BP 06/12/18 0940 (!) 150/91     Pulse Rate 06/12/18 0940 93     Resp 06/12/18 0940 18     Temp 06/12/18 0940 97.9 F (36.6 C)     Temp Source 06/12/18 0940 Oral     SpO2 06/12/18 0940 98 %     Weight 06/12/18 0941 187 lb (84.8 kg)     Height 06/12/18 0941 5\' 2"  (1.575 m)     Head Circumference --      Peak Flow --      Pain Score 06/12/18 0941 0     Pain Loc --      Pain Edu? --      Excl. in [place]? --    No data found.  Updated Vital Signs BP (!) 150/91 (BP Location: Right Arm)   Pulse 93   Temp 97.9 F (36.6 C) (Oral)   Resp 18   Ht 5\' 2"  (1.575 m)   Wt 84.8 kg   SpO2 98%   BMI 34.20 kg/m   Visual Acuity Right Eye Distance:   Left Eye Distance:   Bilateral Distance:    Right Eye Near:   Left Eye Near:    Bilateral Near:     Physical Exam  Constitutional: She appears well-developed and well-nourished. No distress.  HENT:  Head: Normocephalic.  Right Ear: External ear normal.  Left Ear: External ear normal.  Nose: Nose normal.  Mouth/Throat: Oropharynx is clear and moist.  Eyes: Pupils are equal, round, and reactive to light. Conjunctivae are normal.  Neck: Neck supple.  Cardiovascular: Normal heart sounds.  Pulmonary/Chest: Breath sounds normal.  Musculoskeletal: She exhibits no edema.    Lymphadenopathy:    She has no cervical adenopathy.  Neurological: She is alert.  Skin: Skin is warm and dry.     Urticarial pink  eruption on torso and arms as noted on diagram.   Vitals reviewed.    UC Treatments / Results  Labs (all labs ordered are listed, but only abnormal results are displayed) Labs Reviewed - No data to display  EKG None  Radiology No results found.  Procedures Procedures (including critical care time)  Medications Ordered in UC Medications  methylPREDNISolone sodium succinate (SOLU-MEDROL) 125 mg/2 mL injection 80 mg (has no administration in time range)    Initial Impression / Assessment and Plan / UC Course  I have reviewed the triage vital signs and the nursing notes.  Pertinent labs & imaging results that were available during my care of the patient were reviewed by me and considered in my medical decision making (see chart for details).    Etiology of urticaria unknown.  Administered Solumedrol 80mg  IM, then begin prednisone burst/taper. Followup with Family Doctor if not improved in one week.    Final Clinical Impressions(s) / UC Diagnoses   Final diagnoses:  Allergic urticaria     Discharge Instructions     Begin prednisone Sunday 06/13/18. May take non-sedating antihistamine such as Zyrtec for itching.    ED Prescriptions    Medication Sig Dispense Auth. Provider   predniSONE (DELTASONE) 20 MG tablet Take one tab by mouth twice daily for 3 days, then one daily for 2 days. Take with food. 8 tablet Kandra Nicolas, MD         Kandra Nicolas, MD 06/12/18 [phone]

## 2018-06-12 NOTE — ED Triage Notes (Signed)
Pt c/o rash and itching that started yesterday. Was recently prescribed PCN for a tooth abcess but finished that script on Tues evening (she thinks). Took 2 chlortabs last night.

## 2018-06-12 NOTE — Discharge Instructions (Addendum)
Begin prednisone Sunday 06/13/18. May take non-sedating antihistamine such as Zyrtec for itching.

## 2018-12-09 ENCOUNTER — Encounter: Payer: Self-pay | Admitting: Family Medicine

## 2018-12-10 ENCOUNTER — Other Ambulatory Visit: Payer: Self-pay

## 2018-12-10 ENCOUNTER — Encounter: Payer: Self-pay | Admitting: Family Medicine

## 2018-12-10 ENCOUNTER — Ambulatory Visit (INDEPENDENT_AMBULATORY_CARE_PROVIDER_SITE_OTHER): Payer: BC Managed Care – PPO | Admitting: Family Medicine

## 2018-12-10 DIAGNOSIS — R6889 Other general symptoms and signs: Secondary | ICD-10-CM | POA: Diagnosis not present

## 2018-12-10 DIAGNOSIS — S39012A Strain of muscle, fascia and tendon of lower back, initial encounter: Secondary | ICD-10-CM | POA: Diagnosis not present

## 2018-12-10 MED ORDER — DICLOFENAC SODIUM 75 MG PO TBEC: 75.0000 mg | DELAYED_RELEASE_TABLET | Freq: Two times a day (BID) | ORAL | 0 refills | Status: DC

## 2018-12-10 MED ORDER — CYCLOBENZAPRINE HCL 10 MG PO TABS: 10.0000 mg | ORAL_TABLET | Freq: Three times a day (TID) | ORAL | 0 refills | Status: DC | PRN

## 2018-12-10 NOTE — Progress Notes (Signed)
Subjective:    Patient ID: Heidi Crawford, female    DOB: [birth year], 58 y.o.   MRN: [id number]  HPI Here for low back pain. Yesterday morning she woke up with sharp pain and spasms in the lower back. She cannot remembering anything the day before that could have caused this. She used ice and heat, and she is taking Ibuprofen 800 mg every 4 hours. No pain or numbness or tingling in the legs. Today she feels a little better.  Virtual Visit via Video Note  I connected with the patient on 12/10/18 at  2:00 PM EDT by a video enabled telemedicine application and verified that I am speaking with the correct person using two identifiers.  Location patient: home Location provider:work or home office Persons participating in the virtual visit: patient, provider  I discussed the limitations of evaluation and management by telemedicine and the availability of in person appointments. The patient expressed understanding and agreed to proceed.   HPI:    ROS: See pertinent positives and negatives per HPI.  Past Medical History:  Diagnosis Date  . Allergy   . Dependent edema   . GERD (gastroesophageal reflux disease)   . Headache(784.0)   . Hyperlipidemia   . Hypertension   . 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 TMJ pain dysfunction syndrome     Past Surgical History:  Procedure Laterality Date  . COLONOSCOPY  09/08/2014   per Dr. Henrene Pastor, clear, repeat in 10 yrs   . DENTAL SURGERY    . removal skin cancer  2014   from right arm per Dr. Delman Cheadle     Family History  Problem Relation Age of Onset  . Alcohol abuse Unknown        fhx  . Arthritis Unknown        fhx  . Hyperlipidemia Unknown        fhx  . Hypertension Unknown        fhx  . Heart disease Unknown        fhx  . Stroke Unknown        fhx  . Hypertension Mother   . Hypertension Father   . Colon cancer Neg Hx   . Rectal cancer Neg Hx   . Stomach cancer Neg Hx      Current Outpatient Medications:   .  chlorpheniramine (CHLOR-TRIMETON) 4 MG tablet, Take 4 mg by mouth at bedtime., Disp: , Rfl:  .  cyclobenzaprine (FLEXERIL) 10 MG tablet, Take 1 tablet (10 mg total) by mouth 3 (three) times daily as needed for muscle spasms., Disp: 60 tablet, Rfl: 0 .  diclofenac (VOLTAREN) 75 MG EC tablet, Take 1 tablet (75 mg total) by mouth 2 (two) times daily., Disp: 60 tablet, Rfl: 0 .  furosemide (LASIX) 40 MG tablet, TAKE 1 TABLET DAILY, Disp: 90 tablet, Rfl: 3 .  simvastatin (ZOCOR) 20 MG tablet, Take 1 tablet (20 mg total) by mouth at bedtime., Disp: 90 tablet, Rfl: 3 .  tacrolimus (PROTOPIC) 0.1 % ointment, Apply 1 application topically daily., Disp: , Rfl: 0 .  valsartan (DIOVAN) 80 MG tablet, Take 1 tablet (80 mg total) by mouth daily., Disp: 90 tablet, Rfl: 3  EXAM:  VITALS per patient if applicable:  GENERAL: alert, oriented, appears well and in no acute distress  HEENT: atraumatic, conjunttiva clear, no obvious abnormalities on inspection of external nose and ears  NECK: normal movements of the head and neck  LUNGS: on inspection no signs of respiratory distress, breathing rate appears normal, no obvious gross SOB, gasping or wheezing  CV: no obvious cyanosis  MS: moves all visible extremities without noticeable abnormality  PSYCH/NEURO: pleasant and cooperative, no obvious depression or anxiety, speech and thought processing grossly intact  ASSESSMENT AND PLAN: Low back pain. Use Flexeril and Diclofenac prn. Recheck prn.  Alysia Penna, MD  Discussed the following assessment and plan:  Flu-like symptoms - Plan: SAR CoV2 Serology (COVID 19)AB(IGG)IA     I discussed the assessment and treatment plan with the patient. The patient was provided an opportunity to ask questions and all were answered. The patient agreed with the plan and demonstrated an understanding of the instructions.   The patient was advised to call back or seek an in-person evaluation if the symptoms worsen or if  the condition fails to improve as anticipated.    Review of Systems     Objective:   Physical Exam        Assessment & Plan:

## 2018-12-13 NOTE — Telephone Encounter (Signed)
Pt was seen on 6/5 for this strain of her back

## 2018-12-23 ENCOUNTER — Telehealth: Payer: Self-pay | Admitting: Family Medicine

## 2018-12-23 ENCOUNTER — Encounter: Payer: Self-pay | Admitting: Family Medicine

## 2018-12-23 ENCOUNTER — Other Ambulatory Visit: Payer: Self-pay

## 2018-12-23 ENCOUNTER — Ambulatory Visit (INDEPENDENT_AMBULATORY_CARE_PROVIDER_SITE_OTHER): Payer: BC Managed Care – PPO | Admitting: Family Medicine

## 2018-12-23 VITALS — BP 118/86 | HR 101 | Temp 98.2°F | Wt 189.1 lb

## 2018-12-23 DIAGNOSIS — R6889 Other general symptoms and signs: Secondary | ICD-10-CM

## 2018-12-23 DIAGNOSIS — I10 Essential (primary) hypertension: Secondary | ICD-10-CM

## 2018-12-23 DIAGNOSIS — E785 Hyperlipidemia, unspecified: Secondary | ICD-10-CM

## 2018-12-23 DIAGNOSIS — R319 Hematuria, unspecified: Secondary | ICD-10-CM | POA: Diagnosis not present

## 2018-12-23 LAB — LIPID PANEL
Cholesterol: 163 mg/dL (ref 0–200)
HDL: 61.9 mg/dL (ref >39.00)
LDL Cholesterol: 85 mg/dL (ref 0–99)
NonHDL: 100.71
Total CHOL/HDL Ratio: 3
Triglycerides: 78 mg/dL (ref 0.0–149.0)
VLDL: 15.6 mg/dL (ref 0.0–40.0)

## 2018-12-23 LAB — CBC WITH DIFFERENTIAL/PLATELET
Basophils Absolute: 0.1 10*3/uL (ref 0.0–0.1)
Basophils Relative: 1.5 % (ref 0.0–3.0)
Eosinophils Absolute: 0.2 10*3/uL (ref 0.0–0.7)
Eosinophils Relative: 3.2 % (ref 0.0–5.0)
HCT: 43.4 % (ref 36.0–46.0)
Hemoglobin: 14.6 g/dL (ref 12.0–15.0)
Lymphocytes Relative: 18.4 % (ref 12.0–46.0)
Lymphs Abs: 1.1 10*3/uL (ref 0.7–4.0)
MCHC: 33.6 g/dL (ref 30.0–36.0)
MCV: 89.3 fl (ref 78.0–100.0)
Monocytes Absolute: 0.3 10*3/uL (ref 0.1–1.0)
Monocytes Relative: 5.7 % (ref 3.0–12.0)
Neutro Abs: 4.3 10*3/uL (ref 1.4–7.7)
Neutrophils Relative %: 71.2 % (ref 43.0–77.0)
Platelets: 265 10*3/uL (ref 150.0–400.0)
RBC: 4.86 Mil/uL (ref 3.87–5.11)
RDW: 12.5 % (ref 11.5–15.5)
WBC: 6.1 10*3/uL (ref 4.0–10.5)

## 2018-12-23 LAB — HEPATIC FUNCTION PANEL
ALT: 13 U/L (ref 0–35)
AST: 13 U/L (ref 0–37)
Albumin: 4.6 g/dL (ref 3.5–5.2)
Alkaline Phosphatase: 132 U/L — ABNORMAL HIGH (ref 39–117)
Bilirubin, Direct: 0.1 mg/dL (ref 0.0–0.3)
Total Bilirubin: 0.7 mg/dL (ref 0.2–1.2)
Total Protein: 6.8 g/dL (ref 6.0–8.3)

## 2018-12-23 LAB — POCT URINALYSIS DIPSTICK
Bilirubin, UA: NEGATIVE
Blood, UA: NEGATIVE
Glucose, UA: NEGATIVE
Ketones, UA: NEGATIVE
Leukocytes, UA: NEGATIVE
Nitrite, UA: NEGATIVE
Protein, UA: NEGATIVE
Spec Grav, UA: 1.015 (ref 1.010–1.025)
Urobilinogen, UA: 0.2 E.U./dL
pH, UA: 6 (ref 5.0–8.0)

## 2018-12-23 LAB — BASIC METABOLIC PANEL
BUN: 14 mg/dL (ref 6–23)
CO2: 30 mEq/L (ref 19–32)
Calcium: 9.3 mg/dL (ref 8.4–10.5)
Chloride: 101 mEq/L (ref 96–112)
Creatinine, Ser: 0.78 mg/dL (ref 0.40–1.20)
GFR: 75.81 mL/min (ref >60.00)
Glucose, Bld: 90 mg/dL (ref 70–99)
Potassium: 3.8 mEq/L (ref 3.5–5.1)
Sodium: 141 mEq/L (ref 135–145)

## 2018-12-23 LAB — TSH: TSH: 1.04 u[IU]/mL (ref 0.35–4.50)

## 2018-12-23 MED ORDER — FUROSEMIDE 40 MG PO TABS: ORAL_TABLET | ORAL | 3 refills | Status: DC

## 2018-12-23 MED ORDER — SIMVASTATIN 20 MG PO TABS: 20.0000 mg | ORAL_TABLET | Freq: Every day | ORAL | 3 refills | Status: DC

## 2018-12-23 MED ORDER — VALSARTAN 80 MG PO TABS: 80.0000 mg | ORAL_TABLET | Freq: Every day | ORAL | 3 refills | Status: DC

## 2018-12-23 NOTE — Addendum Note (Signed)
Addended by: Elie Confer on: 12/23/2018 09:47 AM   Modules accepted: Orders

## 2018-12-23 NOTE — Progress Notes (Signed)
   Subjective:    Patient ID: Heidi Crawford, female    DOB: 02-22-1961, 58 y.o.   MRN: [id number]  HPI Here to follow up on HTN and high cholesterol, but she also mentions having a touch of blood in the urine 2 days ago. That day she also had some urgency but no burning or back pain or fever. She has felt fine since then.    Review of Systems  Constitutional: Negative.   Respiratory: Negative.   Cardiovascular: Negative.   Gastrointestinal: Negative.   Genitourinary: Positive for hematuria and urgency. Negative for dysuria, flank pain and frequency.  Neurological: Negative.        Objective:   Physical Exam Constitutional:      General: She is not in acute distress.    Appearance: Normal appearance.  Cardiovascular:     Rate and Rhythm: Normal rate and regular rhythm.     Pulses: Normal pulses.     Heart sounds: Normal heart sounds.  Pulmonary:     Effort: Pulmonary effort is normal.     Breath sounds: Normal breath sounds.  Abdominal:     General: Abdomen is flat. Bowel sounds are normal. There is no distension.     Palpations: Abdomen is soft. There is no mass.     Tenderness: There is no abdominal tenderness. There is no guarding or rebound.     Hernia: No hernia is present.  Neurological:     Mental Status: She is alert.           Assessment & Plan:  Her HTN is stable. We will get fasting labs today to check her lipids, etc. She likely had a mild UTI 2 days ago but it seems this resolved. She will drink plenty of water. We will culture the urine sample.  Alysia Penna, MD

## 2018-12-23 NOTE — Telephone Encounter (Signed)
[facility] TELEPHONE NOTE  Pt has called back and  furosemide (LASIX) 40 MG tablet valsartan (DIOVAN) 80 MG tablet simvastatin (ZOCOR) 20 MG tablet  These 3 scripts were called in today to [facility] per Dr Sarajane Jews. The pt told him the wrong pharmacy. She is very sorry. She is now with [facility]. Would you please reroute, she is no longer with [facility].   [place], [place] - [address] [phone] (Phone) [phone] (Fax)

## 2018-12-24 ENCOUNTER — Encounter: Payer: Self-pay | Admitting: *Deleted

## 2018-12-24 LAB — SAR COV2 SEROLOGY (COVID19)AB(IGG),IA: SARS CoV2 AB IGG: NEGATIVE

## 2018-12-24 MED ORDER — FUROSEMIDE 40 MG PO TABS: ORAL_TABLET | ORAL | 3 refills | Status: DC

## 2018-12-24 MED ORDER — SIMVASTATIN 20 MG PO TABS: 20.0000 mg | ORAL_TABLET | Freq: Every day | ORAL | 3 refills | Status: DC

## 2018-12-24 MED ORDER — VALSARTAN 80 MG PO TABS: 80.0000 mg | ORAL_TABLET | Freq: Every day | ORAL | 3 refills | Status: DC

## 2018-12-24 NOTE — Telephone Encounter (Signed)
Refills were sent to the correct pharmacy.  Nothing further is needed.

## 2018-12-25 ENCOUNTER — Telehealth: Payer: BC Managed Care – PPO | Admitting: Nurse Practitioner

## 2018-12-25 DIAGNOSIS — N3000 Acute cystitis without hematuria: Secondary | ICD-10-CM

## 2018-12-25 LAB — URINE CULTURE
MICRO NUMBER:: 584028
SPECIMEN QUALITY:: ADEQUATE

## 2018-12-25 MED ORDER — NITROFURANTOIN MONOHYD MACRO 100 MG PO CAPS: 100.0000 mg | ORAL_CAPSULE | Freq: Two times a day (BID) | ORAL | 0 refills | Status: DC

## 2018-12-25 NOTE — Progress Notes (Signed)

## 2018-12-27 ENCOUNTER — Encounter: Payer: Self-pay | Admitting: *Deleted

## 2018-12-27 ENCOUNTER — Other Ambulatory Visit: Payer: Self-pay | Admitting: Family Medicine

## 2018-12-27 MED ORDER — CIPROFLOXACIN HCL 500 MG PO TABS: 500.0000 mg | ORAL_TABLET | Freq: Two times a day (BID) | ORAL | 0 refills | Status: DC

## 2018-12-28 NOTE — Telephone Encounter (Signed)
Dr. Fry please advise. Thanks  

## 2018-12-29 NOTE — Telephone Encounter (Signed)
Please disregard my order for Cipro. She should finish out the Nitrofurantoin

## 2019-01-06 ENCOUNTER — Other Ambulatory Visit: Payer: Self-pay | Admitting: Family Medicine

## 2019-03-31 ENCOUNTER — Ambulatory Visit (INDEPENDENT_AMBULATORY_CARE_PROVIDER_SITE_OTHER): Payer: BC Managed Care – PPO

## 2019-03-31 ENCOUNTER — Other Ambulatory Visit: Payer: Self-pay

## 2019-03-31 DIAGNOSIS — Z23 Encounter for immunization: Secondary | ICD-10-CM

## 2019-04-20 ENCOUNTER — Encounter: Payer: Self-pay | Admitting: Family Medicine

## 2019-04-20 NOTE — Telephone Encounter (Signed)
Please advise 

## 2019-04-22 NOTE — Telephone Encounter (Signed)
The form is ready  

## 2019-05-20 ENCOUNTER — Ambulatory Visit (INDEPENDENT_AMBULATORY_CARE_PROVIDER_SITE_OTHER): Payer: BC Managed Care – PPO | Admitting: Family Medicine

## 2019-05-20 ENCOUNTER — Encounter: Payer: Self-pay | Admitting: Family Medicine

## 2019-05-20 ENCOUNTER — Other Ambulatory Visit: Payer: Self-pay

## 2019-05-20 VITALS — BP 120/80 | HR 86 | Temp 97.9°F | Ht 62.0 in | Wt 192.4 lb

## 2019-05-20 DIAGNOSIS — S00412A Abrasion of left ear, initial encounter: Secondary | ICD-10-CM | POA: Diagnosis not present

## 2019-05-20 DIAGNOSIS — K219 Gastro-esophageal reflux disease without esophagitis: Secondary | ICD-10-CM | POA: Diagnosis not present

## 2019-05-20 MED ORDER — OMEPRAZOLE 40 MG PO CPDR: 40.0000 mg | DELAYED_RELEASE_CAPSULE | Freq: Every day | ORAL | 3 refills | Status: DC

## 2019-05-20 NOTE — Progress Notes (Signed)
   Subjective:    Patient ID: Heidi Crawford, female    DOB: 04-01-1961, 58 y.o.   MRN: [id number]  HPI Here for 2 issues. First after cleaning her ears with Q Tips this morning (which she has done every day for years) she noticed blood coming from the left ear canal. There is no pain. Also she thinks her GERD has been acting up. In the past she had complained of frequent heartburn and also of an irritation in the back of the throat that made her want to clear her throat or cough. She started taking Omeprazole 20 mg OTC every day, and these symptoms went away. Now for the past month they are coming back. No abdominal pain or nausea.    Review of Systems  Constitutional: Negative.   HENT: Positive for ear discharge. Negative for congestion, ear pain, sinus pressure, sinus pain, sore throat and trouble swallowing.   Eyes: Negative.   Respiratory: Positive for choking. Negative for cough, shortness of breath and wheezing.   Cardiovascular: Negative.   Gastrointestinal: Negative.        Objective:   Physical Exam Constitutional:      Appearance: Normal appearance. She is not ill-appearing.  HENT:     Right Ear: Tympanic membrane, ear canal and external ear normal.     Left Ear: Tympanic membrane and external ear normal.     Ears:     Comments: Left ear canal is red and has a small clot of blood on the anterior wall     Nose: No congestion.     Mouth/Throat:     Pharynx: Oropharynx is clear.  Eyes:     Conjunctiva/sclera: Conjunctivae normal.  Cardiovascular:     Rate and Rhythm: Normal rate and regular rhythm.     Pulses: Normal pulses.     Heart sounds: Normal heart sounds.  Pulmonary:     Effort: Pulmonary effort is normal.     Breath sounds: Normal breath sounds.  Abdominal:     General: Abdomen is flat. Bowel sounds are normal. There is no distension.     Palpations: Abdomen is soft. There is no mass.     Tenderness: There is no abdominal tenderness. There is no guarding or  rebound.     Hernia: No hernia is present.  Lymphadenopathy:     Cervical: No cervical adenopathy.  Neurological:     Mental Status: She is alert.           Assessment & Plan:  She has an abrasion of the left ear canal from using a Q Tip. This should heal on its own in the next few days. I advised her to never put foreign objects into her ear canals. Also her GERD has picked up a bit, so she will increase the Omeprazole to 40 mg daily.  Alysia Penna, MD

## 2019-05-20 NOTE — Patient Instructions (Signed)
Health Maintenance Due  Topic Date Due  . Hepatitis C Screening  13-Dec-1960  . HIV Screening  10/19/1975  . TETANUS/TDAP  10/19/1979  . PAP SMEAR-Modifier  10/18/1981  . MAMMOGRAM  01/26/2015    Depression screen PHQ 2/9 11/20/2016  Decreased Interest 0  Down, Depressed, Hopeless 0  PHQ - 2 Score 0

## 2019-06-22 DIAGNOSIS — Z1389 Encounter for screening for other disorder: Secondary | ICD-10-CM | POA: Diagnosis not present

## 2019-06-22 DIAGNOSIS — Z6835 Body mass index (BMI) 35.0-35.9, adult: Secondary | ICD-10-CM | POA: Diagnosis not present

## 2019-06-22 DIAGNOSIS — Z130 Encounter for screening for diseases of the blood and blood-forming organs and certain disorders involving the immune mechanism: Secondary | ICD-10-CM | POA: Diagnosis not present

## 2019-06-22 DIAGNOSIS — Z1231 Encounter for screening mammogram for malignant neoplasm of breast: Secondary | ICD-10-CM | POA: Diagnosis not present

## 2019-06-22 DIAGNOSIS — Z01419 Encounter for gynecological examination (general) (routine) without abnormal findings: Secondary | ICD-10-CM | POA: Diagnosis not present

## 2019-11-26 ENCOUNTER — Other Ambulatory Visit: Payer: Self-pay | Admitting: Family Medicine

## 2019-11-28 ENCOUNTER — Encounter: Payer: Self-pay | Admitting: Family Medicine

## 2019-11-28 ENCOUNTER — Ambulatory Visit (INDEPENDENT_AMBULATORY_CARE_PROVIDER_SITE_OTHER): Payer: BC Managed Care – PPO | Admitting: Family Medicine

## 2019-11-28 ENCOUNTER — Other Ambulatory Visit: Payer: Self-pay

## 2019-11-28 VITALS — BP 126/78 | HR 84 | Temp 97.7°F | Wt 192.6 lb

## 2019-11-28 DIAGNOSIS — M7661 Achilles tendinitis, right leg: Secondary | ICD-10-CM

## 2019-11-28 NOTE — Progress Notes (Signed)
  Subjective:     Patient ID: Heidi Crawford, female   DOB: 11/29/1960, 59 y.o.   MRN: [id number]  HPI   Heidi Crawford is seen with right Achilles pain for the past week or so.  She has tenderness to palpation but is ambulating fairly well.  Denies any injury.  She did recently changed to some flatter shoes for the summer.  She is not seeing any actual swelling.  She has not taken anything for this. No history of similar problem  Past Medical History:  Diagnosis Date  . Allergy   . Dependent edema   . GERD (gastroesophageal reflux disease)   . Headache(784.0)   . Hyperlipidemia   . Hypertension   . 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 TMJ pain dysfunction syndrome    Past Surgical History:  Procedure Laterality Date  . COLONOSCOPY  09/08/2014   per Dr. Henrene Pastor, clear, repeat in 10 yrs   . DENTAL SURGERY    . removal skin cancer  2014   from right arm per Dr. Delman Cheadle     reports that she quit smoking about 19 years ago. She has never used smokeless tobacco. She reports that she does not drink alcohol or use drugs. family history includes Alcohol abuse in her unknown relative; Arthritis in her unknown relative; Heart disease in her unknown relative; Hyperlipidemia in her unknown relative; Hypertension in her father, mother, and unknown relative; Stroke in her unknown relative. Allergies  Allergen Reactions  . Amoxicillin-Pot Clavulanate Diarrhea    REACTION: diarrhea  . Penicillins Hives    hives     Review of Systems  Neurological: Negative for weakness and numbness.       Objective:   Physical Exam Vitals reviewed.  Constitutional:      Appearance: Normal appearance.  Cardiovascular:     Rate and Rhythm: Normal rate and regular rhythm.  Musculoskeletal:     Comments: Right ankle reveals full range of motion.  She has tenderness near the attachment of the Achilles to calcaneus.  No pain with dorsiflexion or plantarflexion.  No evidence for ankle effusion.  No  warmth.  No erythema.  No bony tenderness.  Neurological:     Mental Status: She is alert.        Assessment:     Right Achilles tendinitis    Plan:     -Recommend trial over-the-counter diclofenac gel 3-4 times daily  -She will try to also alternate with some icing and gentle stretches throughout the day  -Follow-up with primary if not improving over the next few weeks  Eulas Post MD [facility]

## 2019-11-28 NOTE — Patient Instructions (Addendum)
[employer]'s Emergency Medicine: Concepts and Clinical Practice (9th ed., pp. 1392-1401). [place], [place]: Else[employer]. Retrieved from https://www.clinicalkey.com/#!/content/[email or web address]">  Achilles Tendinitis  Achilles tendinitis is inflammation of the tough, cord-like band that attaches the lower leg muscles to the heel bone (Achilles tendon). This is usually caused by overusing the tendon and the ankle joint. Achilles tendinitis usually gets better over time with treatment and caring for yourself at home. It can take weeks or months to heal completely. What are the causes? This condition may be caused by:  A sudden increase in exercise or activity, such as running.  Doing the same exercises or activities, such as jumping, over and over.  Not warming up calf muscles before exercising.  Exercising in shoes that are worn out or not made for exercise.  Having arthritis or a bone growth (spur) on the back of the heel bone. This can rub against the tendon and hurt it.  Age-related wear and tear. Tendons become less flexible with age and are more likely to be injured. What are the signs or symptoms? Common symptoms of this condition include:  Pain in the Achilles tendon or in the back of the leg, just above the heel. The pain usually gets worse with exercise.  Stiffness or soreness in the back of the leg, especially in the morning.  Swelling of the skin over the Achilles tendon.  Thickening of the tendon.  Trouble standing on tiptoe. How is this diagnosed? This condition is diagnosed based on your symptoms and a physical exam. You may have tests, including:  X-rays.  MRI. How is this treated? The goal of treatment is to relieve symptoms and help your injury heal. Treatment may include:  Decreasing or stopping activities that caused the tendinitis. This may mean switching to low-impact exercises like biking or swimming.  Icing the injured  area.  Doing physical therapy, including strengthening and stretching exercises.  Taking NSAIDs, such as ibuprofen, to help relieve pain and swelling.  Using supportive shoes, wraps, heel lifts, or a walking boot (air cast).  Having surgery. This may be done if your symptoms do not improve after other treatments.  Using high-energy shock wave impulses to stimulate the healing process (extracorporeal shock wave therapy). This is rare.  Having an injection of medicines that help relieve inflammation (corticosteroids). This is rare. Follow these instructions at home: If you have an air cast:  Wear the air cast as told by your health care provider. Remove it only as told by your health care provider.  Loosen it if your toes tingle, become numb, or turn cold and blue.  Keep it clean.  If the air cast is not waterproof: ? Do not let it get wet. ? Cover it with a watertight covering when you take a bath or shower. Managing pain, stiffness, and swelling   If directed, put ice on the injured area. To do this: ? If you have a removable air cast, remove it as told by your health care provider. ? Put ice in a plastic bag. ? Place a towel between your skin and the bag. ? Leave the ice on for 20 minutes, 2-3 times a day.  Move your toes often to reduce stiffness and swelling.  Raise (elevate) your foot above the level of your heart while you are sitting or lying down. Activity  Gradually return to your normal activities as told by your health care provider. Ask your health care provider what activities are safe for you.  Do not do   activities that cause pain.  Consider doing low-impact exercises, like cycling or swimming.  Ask your health care provider when it is safe to drive if you have an air cast on your foot.  If physical therapy was prescribed, do exercises as told by your health care provider or physical therapist. General instructions  If directed, wrap your foot with an  elastic bandage or other wrap. This can help to keep your tendon from moving too much while it heals. Your health care provider will show you how to wrap your foot correctly.  Wear supportive shoes or heel lifts only as told by your health care provider.  Take over-the-counter and prescription medicines only as told by your health care provider.  Keep all follow-up visits as told by your health care provider. This is important. Contact a health care provider if you:  Have symptoms that get worse.  Have pain that does not get better with medicine.  Develop new, unexplained symptoms.  Develop warmth and swelling in your foot.  Have a fever. Get help right away if you:  Have a sudden popping sound or sensation in your Achilles tendon followed by severe pain.  Cannot move your toes or foot.  Cannot put any weight on your foot.  Your foot or toes become numb and look white or blue even after loosening your bandage or air cast. Summary  Achilles tendinitis is inflammation of the tough, cord-like band that attaches the lower leg muscles to the heel bone (Achilles tendon).  This condition is usually caused by overusing the tendon and the ankle joint. It can also be caused by arthritis or normal aging.  The most common symptoms of this condition include pain, swelling, or stiffness in the Achilles tendon or in the back of the leg.  This condition is usually treated by decreasing or stopping activities that caused the tendinitis, icing the injured area, taking NSAIDs, and doing physical therapy. This information is not intended to replace advice given to you by your health care provider. Make sure you discuss any questions you have with your health care provider. Document Revised: 11/08/2018 Document Reviewed: 11/08/2018 Elsevier Patient Education  [place].  Try some Voltaren OTC gel and use 3-4 times daily.

## 2019-12-14 ENCOUNTER — Encounter: Payer: Self-pay | Admitting: Family Medicine

## 2020-03-21 ENCOUNTER — Encounter: Payer: Self-pay | Admitting: Family Medicine

## 2020-03-22 NOTE — Telephone Encounter (Signed)
So far the booster is only recommended by the CDC  for those with severely compromised immune systems. We are still waiting for advice as for everyone else

## 2020-03-26 ENCOUNTER — Encounter: Payer: Self-pay | Admitting: Family Medicine

## 2020-04-02 NOTE — Telephone Encounter (Signed)
Go ahead and get the flu shot now. I like to wait at least 2 weeks between this and a Covid booster

## 2020-04-23 ENCOUNTER — Ambulatory Visit (INDEPENDENT_AMBULATORY_CARE_PROVIDER_SITE_OTHER): Payer: BC Managed Care – PPO | Admitting: *Deleted

## 2020-04-23 ENCOUNTER — Other Ambulatory Visit: Payer: Self-pay

## 2020-04-23 DIAGNOSIS — Z23 Encounter for immunization: Secondary | ICD-10-CM

## 2020-06-25 DIAGNOSIS — H04123 Dry eye syndrome of bilateral lacrimal glands: Secondary | ICD-10-CM | POA: Diagnosis not present

## 2020-06-25 DIAGNOSIS — H1045 Other chronic allergic conjunctivitis: Secondary | ICD-10-CM | POA: Diagnosis not present

## 2020-06-25 DIAGNOSIS — H2513 Age-related nuclear cataract, bilateral: Secondary | ICD-10-CM | POA: Diagnosis not present

## 2020-06-25 DIAGNOSIS — D3132 Benign neoplasm of left choroid: Secondary | ICD-10-CM | POA: Diagnosis not present

## 2020-07-12 DIAGNOSIS — Z124 Encounter for screening for malignant neoplasm of cervix: Secondary | ICD-10-CM | POA: Diagnosis not present

## 2020-07-12 DIAGNOSIS — Z1231 Encounter for screening mammogram for malignant neoplasm of breast: Secondary | ICD-10-CM | POA: Diagnosis not present

## 2020-07-12 DIAGNOSIS — Z130 Encounter for screening for diseases of the blood and blood-forming organs and certain disorders involving the immune mechanism: Secondary | ICD-10-CM | POA: Diagnosis not present

## 2020-07-12 DIAGNOSIS — Z1151 Encounter for screening for human papillomavirus (HPV): Secondary | ICD-10-CM | POA: Diagnosis not present

## 2020-07-12 DIAGNOSIS — Z01419 Encounter for gynecological examination (general) (routine) without abnormal findings: Secondary | ICD-10-CM | POA: Diagnosis not present

## 2020-09-13 DIAGNOSIS — D485 Neoplasm of uncertain behavior of skin: Secondary | ICD-10-CM | POA: Diagnosis not present

## 2020-09-13 DIAGNOSIS — L821 Other seborrheic keratosis: Secondary | ICD-10-CM | POA: Diagnosis not present

## 2020-09-13 DIAGNOSIS — B078 Other viral warts: Secondary | ICD-10-CM | POA: Diagnosis not present

## 2020-09-28 ENCOUNTER — Other Ambulatory Visit: Payer: Self-pay

## 2020-10-01 ENCOUNTER — Encounter: Payer: Self-pay | Admitting: Family Medicine

## 2020-10-01 ENCOUNTER — Other Ambulatory Visit: Payer: Self-pay

## 2020-10-01 ENCOUNTER — Ambulatory Visit (INDEPENDENT_AMBULATORY_CARE_PROVIDER_SITE_OTHER): Payer: BC Managed Care – PPO

## 2020-10-01 ENCOUNTER — Ambulatory Visit (INDEPENDENT_AMBULATORY_CARE_PROVIDER_SITE_OTHER): Payer: BC Managed Care – PPO | Admitting: Family Medicine

## 2020-10-01 VITALS — BP 126/80 | HR 95 | Temp 98.4°F | Wt 196.0 lb

## 2020-10-01 DIAGNOSIS — M79605 Pain in left leg: Secondary | ICD-10-CM

## 2020-10-01 DIAGNOSIS — M79662 Pain in left lower leg: Secondary | ICD-10-CM | POA: Diagnosis not present

## 2020-10-01 NOTE — Progress Notes (Signed)
   Subjective:    Patient ID: Rosanna Bickle, female    DOB: [birth year], 60 y.o.   MRN: [id number]  HPI Here for 3 weeks of pain in the left shin area. No recent trauma. She does not run, but she walks her dog twice a day and this consists of going up and long hill and then coming back down the hill. This has not swollen and it never changed colors. She has done nothing for it.    Review of Systems  Constitutional: Negative.   Respiratory: Negative.   Cardiovascular: Negative.   Musculoskeletal: Positive for myalgias.       Objective:   Physical Exam Constitutional:      Appearance: Normal appearance.     Comments: Walks easily   Cardiovascular:     Rate and Rhythm: Normal rate and regular rhythm.     Pulses: Normal pulses.     Heart sounds: Normal heart sounds.  Pulmonary:     Effort: Pulmonary effort is normal.     Breath sounds: Normal breath sounds.  Musculoskeletal:     Comments: Left lower anterior leg appears normal, no swelling or erythema. This are is warm to the touch and it is tender. Dorsiflexing the left foot against resistance is painful   Neurological:     Mental Status: She is alert.           Assessment & Plan:  Shin splints. We will get Xrays of the left tibia, fibula today. She will rest the leg and she will let her husband walk the dog for the next 2 weeks. Apply ice TID and take 2 Aleve BID. Recheck as needed.  Alysia Penna, MD

## 2020-11-22 ENCOUNTER — Other Ambulatory Visit: Payer: Self-pay | Admitting: Family Medicine

## 2020-12-11 ENCOUNTER — Encounter: Payer: Self-pay | Admitting: Family Medicine

## 2020-12-11 DIAGNOSIS — M25562 Pain in left knee: Secondary | ICD-10-CM

## 2020-12-11 DIAGNOSIS — M25572 Pain in left ankle and joints of left foot: Secondary | ICD-10-CM

## 2020-12-11 NOTE — Telephone Encounter (Signed)
I will refer her to Orthopedics to evaluate these areas further

## 2020-12-12 NOTE — Telephone Encounter (Signed)
Spoke with pt state that she got a call this morning for scheduling

## 2020-12-17 ENCOUNTER — Ambulatory Visit: Payer: Self-pay

## 2020-12-17 ENCOUNTER — Ambulatory Visit (INDEPENDENT_AMBULATORY_CARE_PROVIDER_SITE_OTHER): Payer: BC Managed Care – PPO | Admitting: Physician Assistant

## 2020-12-17 ENCOUNTER — Encounter: Payer: Self-pay | Admitting: Orthopedic Surgery

## 2020-12-17 DIAGNOSIS — M25572 Pain in left ankle and joints of left foot: Secondary | ICD-10-CM

## 2020-12-17 DIAGNOSIS — G8929 Other chronic pain: Secondary | ICD-10-CM

## 2020-12-17 DIAGNOSIS — M25562 Pain in left knee: Secondary | ICD-10-CM

## 2020-12-17 NOTE — Progress Notes (Signed)
Office Visit Note   Patient: Heidi Crawford           Date of Birth: 06-06-1961           MRN: [id number] Visit Date: 12/17/2020              Requested by: Laurey Morale, MD [place],  [place] [address] PCP: Laurey Morale, MD  Chief Complaint  Patient presents with   Left Knee - Pain   Left Ankle - Pain      HPI: Patient is a pleasant 60 year old woman who comes in today with a chief complaint of left knee weakness and left posterior ankle pain.  She is approximately 3 months status post a wrist having her large dog run into her left leg.  Initially she had pain across the front of her leg just above her ankle.  This resolved but then she noticed that she had more prominence in the back of her heel and began having there.  She denies any traumatic injury specifically to this area.  She thinks she may just be offloading it and walking differently.  She is also complaining of knee weakness not instability symptoms the injury.  Most prevalent when she is climbing stairs denies any swelling otherwise she is quite healthy pain in her ankle is relieved with wearing a higher stepped shoe  Assessment & Plan: Visit Diagnoses:  1. Chronic pain of left knee   2. Pain in left ankle and joints of left foot     Plan: Close chain quadricep exercises were demonstrated to her today.  She should try to do these 3 times daily.  Also Alfredson's protocol for eccentric Achilles strengthening was demonstrated and given her information about this.  Also advised her to start wearing using Voltaren gel over this area.  We will follow-up in 1 mont  Follow-Up Instructions: No follow-ups on file.   Ortho Exam  Patient is alert, oriented, no adenopathy, well-dressed, normal affect, normal respiratory effort. Examination of left knee no effusion no redness no tenderness over the joint lines.  Good endpoint on Lachman testing.  Valgus and varus stress testing is normal. Left ankle strong  palpable dorsalis pedis pulse no swelling no redness.  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  She is focally tender at the insertion of the distal Achilles.  She has good plantar flexion dorsiflexion strength inversion eversion no tenderness in the ankle itself  Imaging: No results found. No images are attached to the encounter.  Labs: No results found for: HGBA1C, ESRSEDRATE, CRP, LABURIC, REPTSTATUS, GRAMSTAIN, CULT, LABORGA   Lab Results  Component Value Date   ALBUMIN 4.6 12/23/2018   ALBUMIN 4.2 11/13/2017   ALBUMIN 4.4 11/20/2016    No results found for: MG No results found for: VD25OH  No results found for: PREALBUMIN CBC EXTENDED Latest Ref Rng & Units 12/23/2018 11/13/2017 11/20/2016  WBC 4.0 - 10.5 K/uL 6.1 4.9 4.4  RBC 3.87 - 5.11 Mil/uL 4.86 4.67 4.72  HGB 12.0 - 15.0 g/dL 14.6 14.4 14.2  HCT 36.0 - 46.0 % 43.4 41.2 42.3  PLT 150.0 - 400.0 K/uL 265.0 274.0 254.0  NEUTROABS 1.4 - 7.7 K/uL 4.3 3.2 2.8  LYMPHSABS 0.7 - 4.0 K/uL 1.1 1.2 0.9     There is no height or weight on file to calculate BMI.  Orders:  Orders Placed This Encounter  Procedures   XR Knee 1-2  Views Left   XR Ankle 2 Views Left   No orders of the defined types were placed in this encounter.    Procedures: No procedures performed  Clinical Data: No additional findings.  ROS:  All other systems negative, except as noted in the HPI. Review of Systems  Objective: Vital Signs: There were no vitals taken for this visit.  Specialty Comments:  No specialty comments available.  PMFS History: Patient Active Problem List   Diagnosis Date Noted   GERD (gastroesophageal reflux disease) 05/20/2019   Hyperlipidemia 05/04/2012   Essential hypertension 03/05/2009   COUGH 12/27/2007   ALLERGIC RHINITIS 12/03/2007   DEPENDENT EDEMA 11/10/2007   TMJ PAIN 03/04/2007   HEADACHE 03/04/2007   Past Medical History:  Diagnosis Date   Allergy     Dependent edema    GERD (gastroesophageal reflux disease)    Headache(784.0)    Hyperlipidemia    Hypertension    Squamous cell cancer of skin of forearm    right arm    TMJ pain dysfunction syndrome     Family History  Problem Relation Age of Onset   Alcohol abuse Unknown        fhx   Arthritis Unknown        fhx   Hyperlipidemia Unknown        fhx   Hypertension Unknown        fhx   Heart disease Unknown        fhx   Stroke Unknown        fhx   Hypertension Mother    Hypertension Father    Colon cancer Neg Hx    Rectal cancer Neg Hx    Stomach cancer Neg Hx     Past Surgical History:  Procedure Laterality Date   COLONOSCOPY  09/08/2014   per Dr. Henrene Pastor, clear, repeat in 30 yrs    DENTAL SURGERY     removal skin cancer  2014   from right arm per Dr. Delman Cheadle    Social History   Occupational History   Not on file  Tobacco Use   Smoking status: Former    Pack years: 0.00    Types: Cigarettes    Quit date: 07/07/2000    Years since quitting: 20.4   Smokeless tobacco: Never  Vaping Use   Vaping Use: Never used  Substance and Sexual Activity   Alcohol use: No    Alcohol/week: 0.0 standard drinks   Drug use: No   Sexual activity: Not on file

## 2021-01-11 ENCOUNTER — Encounter: Payer: Self-pay | Admitting: Family Medicine

## 2021-01-15 ENCOUNTER — Ambulatory Visit (INDEPENDENT_AMBULATORY_CARE_PROVIDER_SITE_OTHER): Payer: BC Managed Care – PPO | Admitting: Physician Assistant

## 2021-01-15 ENCOUNTER — Encounter: Payer: Self-pay | Admitting: Physician Assistant

## 2021-01-15 DIAGNOSIS — M25562 Pain in left knee: Secondary | ICD-10-CM | POA: Diagnosis not present

## 2021-01-15 DIAGNOSIS — G8929 Other chronic pain: Secondary | ICD-10-CM | POA: Diagnosis not present

## 2021-01-15 NOTE — Progress Notes (Signed)
Office Visit Note   Patient: Heidi Crawford           Date of Birth: September 26, 1960           MRN: [id number] Visit Date: 01/15/2021              Requested by: Laurey Morale, MD [place],  [place] [address] PCP: Laurey Morale, MD  No chief complaint on file.     HPI: Patient presents today for follow-up on her left knee and left posterior ankle.  At her last visit she was told close chain exercises for her knee as well as Alfredson's protocol for her Achilles.  She reports both of these have helped significantly.  She also uses Voltaren and finds these to be helpful.  Assessment & Plan: Visit Diagnoses: No diagnosis found.  Plan: Patient may follow-up as needed.  For the knee she could get an injection but she would prefer to continue with her exercises.  She is not interested in any surgical intervention at this time nor does she need it  Follow-Up Instructions: No follow-ups on file.   Ortho Exam  Patient is alert, oriented, no adenopathy, well-dressed, normal affect, normal respiratory effort. Decreased swelling in her left posterior ankle good flexibility with dorsiflexion and plantarflexion minimal tenderness to palpation over the retrocalcaneal bursa.  Left knee no effusion no swelling good range of motion no tenderness around the kneecap  Imaging: No results found. No images are attached to the encounter.  Labs: No results found for: HGBA1C, ESRSEDRATE, CRP, LABURIC, REPTSTATUS, GRAMSTAIN, CULT, LABORGA   Lab Results  Component Value Date   ALBUMIN 4.6 12/23/2018   ALBUMIN 4.2 11/13/2017   ALBUMIN 4.4 11/20/2016    No results found for: MG No results found for: VD25OH  No results found for: PREALBUMIN CBC EXTENDED Latest Ref Rng & Units 12/23/2018 11/13/2017 11/20/2016  WBC 4.0 - 10.5 K/uL 6.1 4.9 4.4  RBC 3.87 - 5.11 Mil/uL 4.86 4.67 4.72  HGB 12.0 - 15.0 g/dL 14.6 14.4 14.2  HCT 36.0 - 46.0 % 43.4 41.2 42.3  PLT 150.0 - 400.0 K/uL 265.0  274.0 254.0  NEUTROABS 1.4 - 7.7 K/uL 4.3 3.2 2.8  LYMPHSABS 0.7 - 4.0 K/uL 1.1 1.2 0.9     There is no height or weight on file to calculate BMI.  Orders:  No orders of the defined types were placed in this encounter.  No orders of the defined types were placed in this encounter.    Procedures: No procedures performed  Clinical Data: No additional findings.  ROS:  All other systems negative, except as noted in the HPI. Review of Systems  Objective: Vital Signs: There were no vitals taken for this visit.  Specialty Comments:  No specialty comments available.  PMFS History: Patient Active Problem List   Diagnosis Date Noted   GERD (gastroesophageal reflux disease) 05/20/2019   Hyperlipidemia 05/04/2012   Essential hypertension 03/05/2009   COUGH 12/27/2007   ALLERGIC RHINITIS 12/03/2007   DEPENDENT EDEMA 11/10/2007   TMJ PAIN 03/04/2007   HEADACHE 03/04/2007   Past Medical History:  Diagnosis Date   Allergy    Dependent edema    GERD (gastroesophageal reflux disease)    Headache(784.0)    Hyperlipidemia    Hypertension    Squamous cell cancer of skin of forearm    right arm    TMJ pain dysfunction syndrome     Family History  Problem Relation Age of Onset  Alcohol abuse Unknown        fhx   Arthritis Unknown        fhx   Hyperlipidemia Unknown        fhx   Hypertension Unknown        fhx   Heart disease Unknown        fhx   Stroke Unknown        fhx   Hypertension Mother    Hypertension Father    Colon cancer Neg Hx    Rectal cancer Neg Hx    Stomach cancer Neg Hx     Past Surgical History:  Procedure Laterality Date   COLONOSCOPY  09/08/2014   per Dr. Henrene Pastor, clear, repeat in 64 yrs    DENTAL SURGERY     removal skin cancer  2014   from right arm per Dr. Delman Cheadle    Social History   Occupational History   Not on file  Tobacco Use   Smoking status: Former    Pack years: 0.00    Types: Cigarettes    Quit date: 07/07/2000    Years  since quitting: 20.5   Smokeless tobacco: Never  Vaping Use   Vaping Use: Never used  Substance and Sexual Activity   Alcohol use: No    Alcohol/week: 0.0 standard drinks   Drug use: No   Sexual activity: Not on file

## 2021-01-30 ENCOUNTER — Encounter: Payer: Self-pay | Admitting: Family Medicine

## 2021-04-18 IMAGING — DX DG TIBIA/FIBULA 2V*L*
2 series · 2 of 2 positions shown · non-contrast
Comparison: None.

CLINICAL DATA: Pain

EXAM:
LEFT TIBIA AND FIBULA - 2 VIEW

[tib/fib ap]
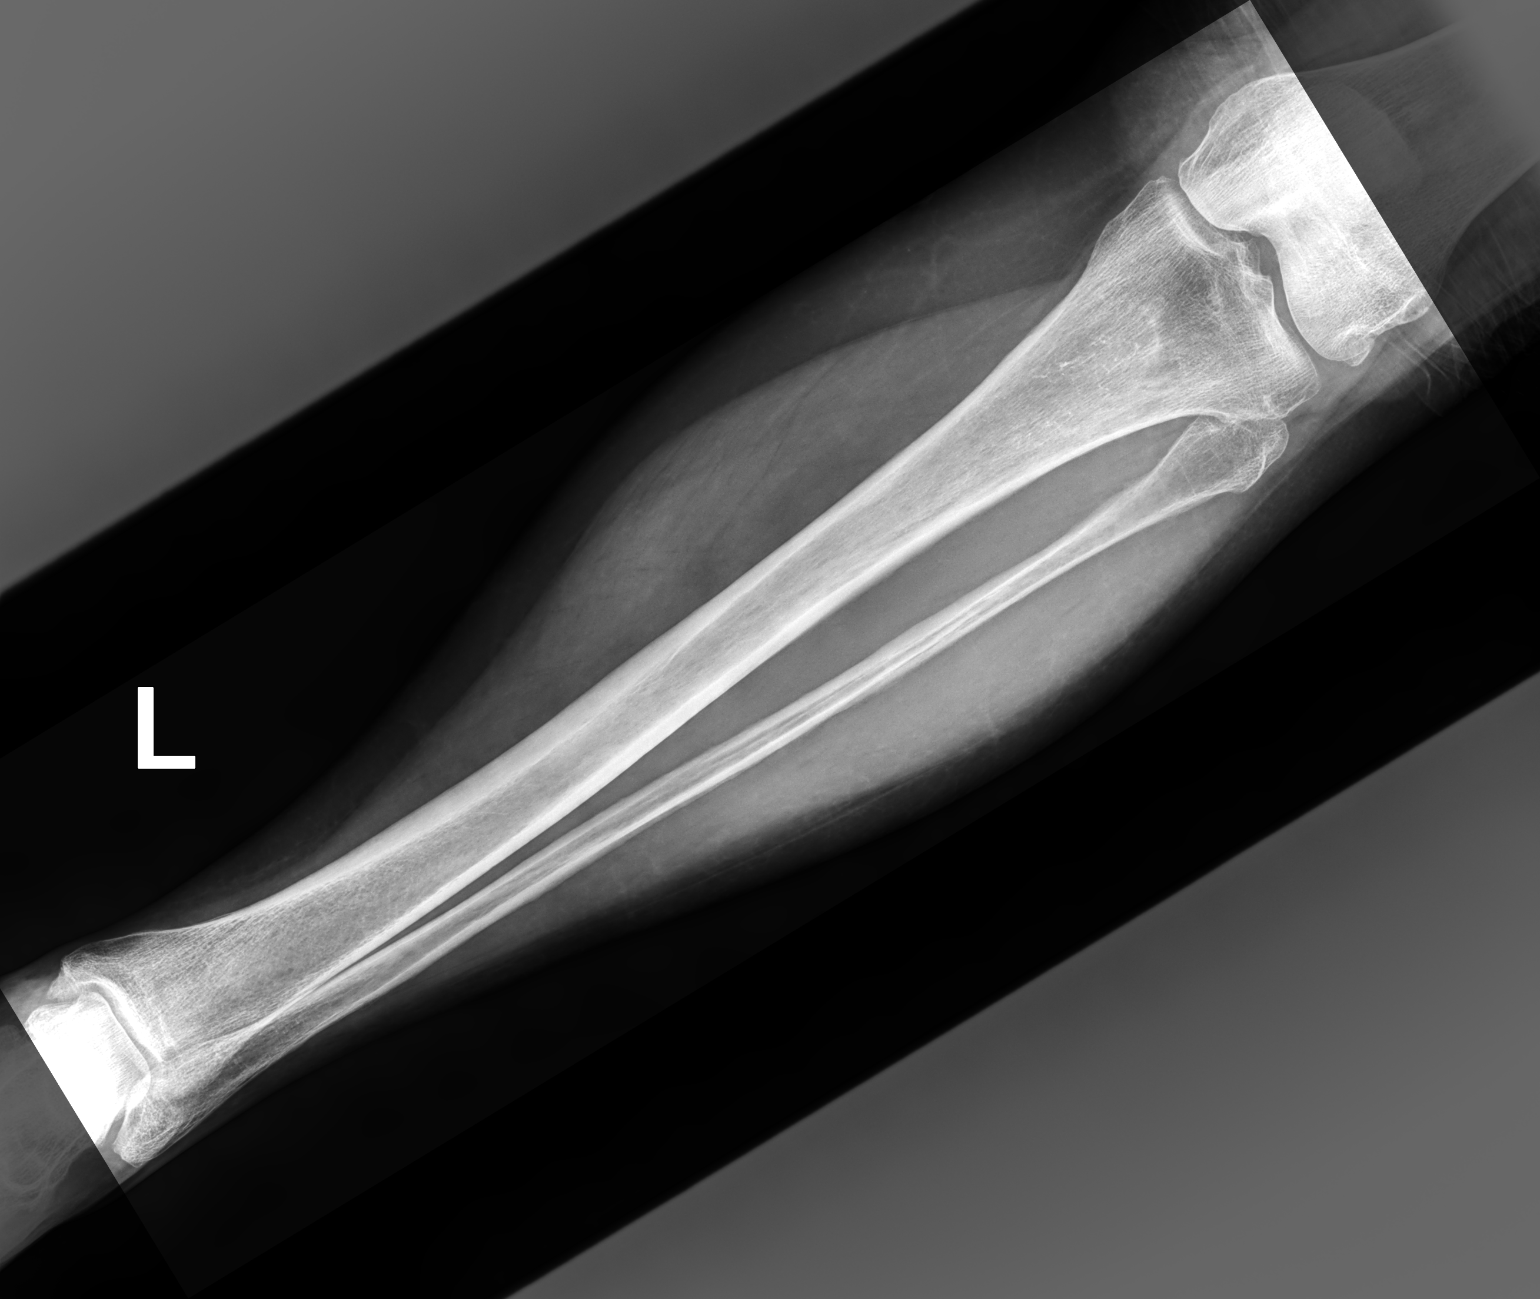

[tib/fib lat]
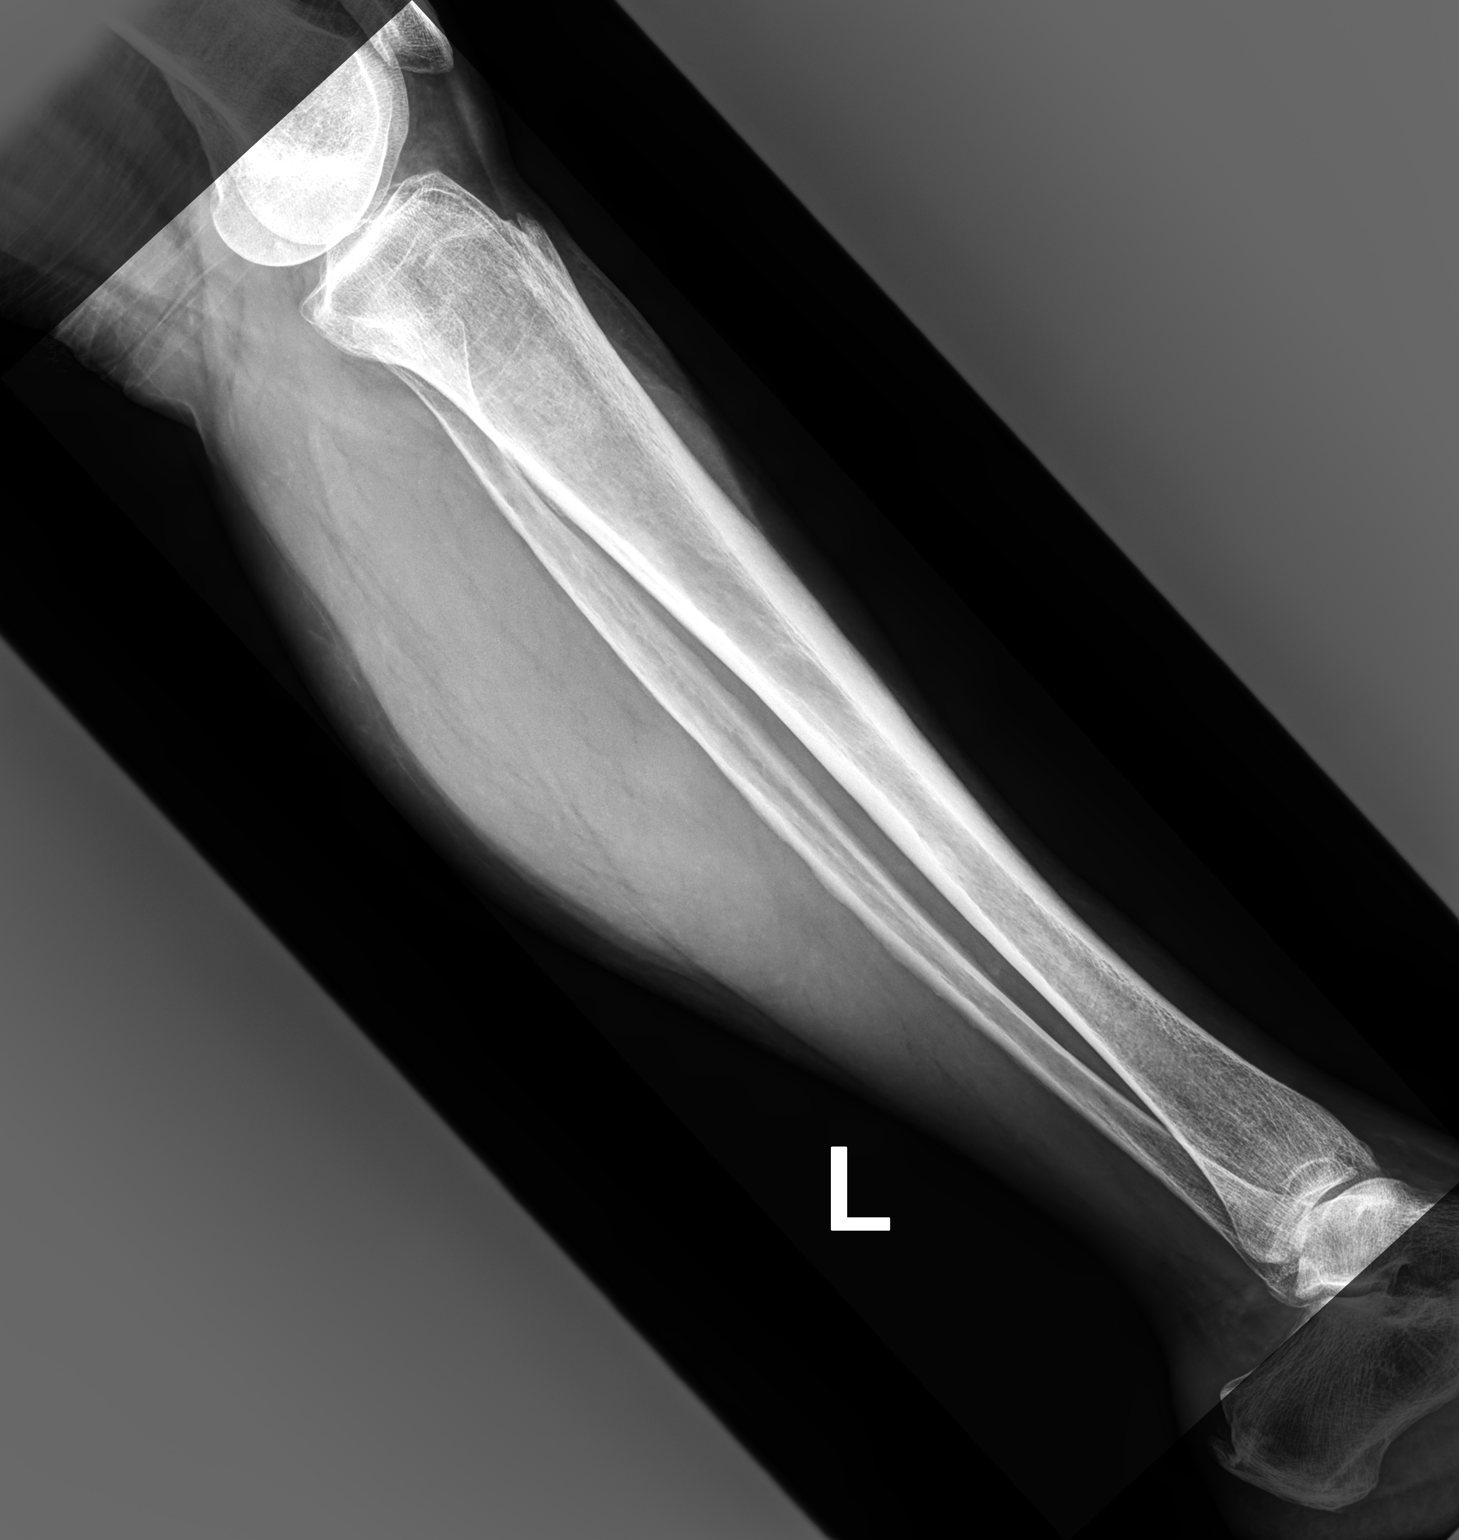

[2 of 2 positions shown; findings below may reference images not displayed]

FINDINGS: There is no evidence of fracture or other focal bone lesions. Soft
tissues are unremarkable.
IMPRESSION: Negative.

## 2021-04-22 ENCOUNTER — Ambulatory Visit (INDEPENDENT_AMBULATORY_CARE_PROVIDER_SITE_OTHER): Payer: BC Managed Care – PPO

## 2021-04-22 ENCOUNTER — Other Ambulatory Visit: Payer: Self-pay

## 2021-04-22 DIAGNOSIS — Z23 Encounter for immunization: Secondary | ICD-10-CM | POA: Diagnosis not present

## 2021-05-14 ENCOUNTER — Ambulatory Visit (INDEPENDENT_AMBULATORY_CARE_PROVIDER_SITE_OTHER): Payer: BC Managed Care – PPO | Admitting: Physician Assistant

## 2021-05-14 ENCOUNTER — Encounter: Payer: Self-pay | Admitting: Physician Assistant

## 2021-05-14 DIAGNOSIS — M9260 Juvenile osteochondrosis of tarsus, unspecified ankle: Secondary | ICD-10-CM

## 2021-05-14 NOTE — Progress Notes (Signed)
Office Visit Note   Patient: Heidi Crawford           Date of Birth: December 13, 1960           MRN: [id number] Visit Date: 05/14/2021              Requested by: Laurey Morale, MD [place],  [place] [id number] PCP: Laurey Morale, MD  Chief Complaint  Patient presents with   Left Ankle - Follow-up      HPI: Patient is a pleasant 60 year old woman who I have seen a few times earlier this year with a chief complaint of left posterior heel pain.  She has a Haglund's deformity with insertional tendinitis with an associated large bony spurring at the posterior calcaneus.  She had been treating this with Alfredson's protocol eccentric Achilles stretching and topical Voltaren which did seem to help.  She has had the return of her symptoms with some associated burning.  She does wear a wedge in her shoe which seems to help.  She also notices now that the weather has changed and she has to wear shoes with a close back this has caused a return of her symptoms.  The pain is especially noticeable when she goes downstairs  Assessment & Plan: Visit Diagnoses:  1. Haglund's deformity     Plan: I had a long discussion and reviewed the patient's x-rays with her today.  She understands that the only option to get rid of the Haglund's in the posterior spurring would be surgical intervention with possible takedown of the deformity.  She may well need detachment of the Achilles to accomplish this.  We discussed the surgery and the rehabilitation.  As discussed the risks including surgical healing complications bleeding infection.  She would like to try 1 round of formal physical therapy.  She would like to try to avoid surgery but that understands that inevitably this may be the only way to completely relieve her symptoms.  She did do well on a self-directed therapy program last time but would like to work formally with physical therapy.  She will follow-up specifically with Dr. Sharol Given in 6 weeks.  If  she is better could continue with conservative treatment however if she had continued bothersome symptoms could have a surgical discussion with Dr. Sharol Given at that time.  We also discussed use of CBD oil and/or compounding creams from compounding pharmacies.  Follow-Up Instructions: No follow-ups on file.   Ortho Exam  Patient is alert, oriented, no adenopathy, well-dressed, normal affect, normal respiratory effort. Examination she has easily palpable dorsalis pedis pulse.  She does have prominence of the posterior heel bilaterally.  Skin is in good condition.  She is tender over the medial side of the posterior heel.  She does have some return of tightness in her Achilles comes to just a neutral position. No signs of infection no signs of skin breakdown she has good plantarflexion and dorsiflexion strength. Imaging: No results found. No images are attached to the encounter.  Labs: No results found for: HGBA1C, ESRSEDRATE, CRP, LABURIC, REPTSTATUS, GRAMSTAIN, CULT, LABORGA   Lab Results  Component Value Date   ALBUMIN 4.6 12/23/2018   ALBUMIN 4.2 11/13/2017   ALBUMIN 4.4 11/20/2016    No results found for: MG No results found for: VD25OH  No results found for: PREALBUMIN CBC EXTENDED Latest Ref Rng & Units 12/23/2018 11/13/2017 11/20/2016  WBC 4.0 - 10.5 K/uL 6.1 4.9 4.4  RBC 3.87 - 5.11 Mil/uL  4.86 4.67 4.72  HGB 12.0 - 15.0 g/dL 14.6 14.4 14.2  HCT 36.0 - 46.0 % 43.4 41.2 42.3  PLT 150.0 - 400.0 K/uL 265.0 274.0 254.0  NEUTROABS 1.4 - 7.7 K/uL 4.3 3.2 2.8  LYMPHSABS 0.7 - 4.0 K/uL 1.1 1.2 0.9     There is no height or weight on file to calculate BMI.  Orders:  Orders Placed This Encounter  Procedures   Ambulatory referral to Physical Therapy   No orders of the defined types were placed in this encounter.    Procedures: No procedures performed  Clinical Data: No additional findings.  ROS:  All other systems negative, except as noted in the HPI. Review of  Systems  Objective: Vital Signs: There were no vitals taken for this visit.  Specialty Comments:  No specialty comments available.  PMFS History: Patient Active Problem List   Diagnosis Date Noted   GERD (gastroesophageal reflux disease) 05/20/2019   Hyperlipidemia 05/04/2012   Essential hypertension 03/05/2009   COUGH 12/27/2007   ALLERGIC RHINITIS 12/03/2007   DEPENDENT EDEMA 11/10/2007   TMJ PAIN 03/04/2007   HEADACHE 03/04/2007   Past Medical History:  Diagnosis Date   Allergy    Dependent edema    GERD (gastroesophageal reflux disease)    Headache(784.0)    Hyperlipidemia    Hypertension    Squamous cell cancer of skin of forearm    right arm    TMJ pain dysfunction syndrome     Family History  Problem Relation Age of Onset   Alcohol abuse Unknown        fhx   Arthritis Unknown        fhx   Hyperlipidemia Unknown        fhx   Hypertension Unknown        fhx   Heart disease Unknown        fhx   Stroke Unknown        fhx   Hypertension Mother    Hypertension Father    Colon cancer Neg Hx    Rectal cancer Neg Hx    Stomach cancer Neg Hx     Past Surgical History:  Procedure Laterality Date   COLONOSCOPY  09/08/2014   per Dr. Henrene Pastor, clear, repeat in 40 yrs    DENTAL SURGERY     removal skin cancer  2014   from right arm per Dr. Delman Cheadle    Social History   Occupational History   Not on file  Tobacco Use   Smoking status: Former    Types: Cigarettes    Quit date: 07/07/2000    Years since quitting: 20.8   Smokeless tobacco: Never  Vaping Use   Vaping Use: Never used  Substance and Sexual Activity   Alcohol use: No    Alcohol/week: 0.0 standard drinks   Drug use: No   Sexual activity: Not on file

## 2021-05-23 ENCOUNTER — Other Ambulatory Visit: Payer: Self-pay

## 2021-05-23 ENCOUNTER — Encounter: Payer: Self-pay | Admitting: Rehabilitative and Restorative Service Providers"

## 2021-05-23 ENCOUNTER — Ambulatory Visit (INDEPENDENT_AMBULATORY_CARE_PROVIDER_SITE_OTHER): Payer: BC Managed Care – PPO | Admitting: Rehabilitative and Restorative Service Providers"

## 2021-05-23 DIAGNOSIS — M6281 Muscle weakness (generalized): Secondary | ICD-10-CM

## 2021-05-23 DIAGNOSIS — R262 Difficulty in walking, not elsewhere classified: Secondary | ICD-10-CM | POA: Diagnosis not present

## 2021-05-23 DIAGNOSIS — M25572 Pain in left ankle and joints of left foot: Secondary | ICD-10-CM | POA: Diagnosis not present

## 2021-05-23 DIAGNOSIS — M25672 Stiffness of left ankle, not elsewhere classified: Secondary | ICD-10-CM

## 2021-05-23 DIAGNOSIS — R600 Localized edema: Secondary | ICD-10-CM

## 2021-05-23 NOTE — Therapy (Signed)
[facility] Physical Therapy [address] [place], [place], [address] Phone: [phone]   Fax:  [phone]  Physical Therapy Evaluation  Patient Details  Name: Heidi Crawford MRN: [id number] Date of Birth: [birth year] Referring Provider (PT): Bevely Palmer Persons PA   Encounter Date: 05/23/2021   PT End of Session - 05/23/21 1008     Visit Number 1    Number of Visits 12    Date for PT Re-Evaluation 07/11/21    Progress Note Due on Visit 12    PT Start Time 0847    PT Stop Time 0930    PT Time Calculation (min) 43 min    Activity Tolerance Patient tolerated treatment well    Behavior During Therapy [facility] for tasks assessed/performed             Past Medical History:  Diagnosis Date   Allergy    Dependent edema    GERD (gastroesophageal reflux disease)    Headache(784.0)    Hyperlipidemia    Hypertension    Squamous cell cancer of skin of forearm    right arm    TMJ pain dysfunction syndrome     Past Surgical History:  Procedure Laterality Date   COLONOSCOPY  09/08/2014   per Dr. Henrene Pastor, clear, repeat in 10 yrs    DENTAL SURGERY     removal skin cancer  2014   from right arm per Dr. Delman Cheadle     There were no vitals filed for this visit.    Subjective Assessment - 05/23/21 1004     Subjective Heidi Crawford has struggles with L Achilles tendonitis for > a year.  Decending stairs is most functionally limiting.  She also has a Haglund's deformity of the L calcaneous.    Pertinent History No    Limitations Walking;Lifting;House hold activities;Standing    How long can you sit comfortably? No problem    How long can you stand comfortably? OK with standing but can get "tingly"    How long can you walk comfortably? OK with walking but pain with descending stairs    Patient Stated Goals Be able to descend stairs without limitations and avoid surgery    Currently in Pain? No/denies    Multiple Pain Sites No                [facility] PT Assessment -  05/23/21 0001       Assessment   Medical Diagnosis Insertional L Achilles tendonitis    Referring Provider (PT) Bevely Palmer Persons PA    Onset Date/Surgical Date --   Chronic (> a year)     Precautions   Precautions None      Restrictions   Weight Bearing Restrictions No      Balance Screen   Has the patient fallen in the past 6 months No    Has the patient had a decrease in activity level because of a fear of falling?  No    Is the patient reluctant to leave their home because of a fear of falling?  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      Cognition   Overall Cognitive Status Within Functional Limits for tasks assessed  Observation/Other Assessments   Focus on Therapeutic Outcomes (FOTO)  60 (Goal 74 by visit 12)      ROM / Strength   AROM / PROM / Strength AROM;Strength      AROM   Overall AROM  Deficits    AROM Assessment Site Ankle    Right/Left Ankle Left;Right    Right Ankle Dorsiflexion 4    Left Ankle Dorsiflexion 4      Strength   Overall Strength Deficits    Strength Assessment Site Ankle    Right/Left Ankle Left;Right    Right Ankle Inversion --   27.8 pounds   Right Ankle Eversion --   35.6 pounds   Left Ankle Inversion --   21.0 pounds   Left Ankle Eversion --   26.5 pounds                       Objective measurements completed on examination: See above findings.       [facility] Adult PT Treatment/Exercise - 05/23/21 0001       Therapeutic Activites    Therapeutic Activities Other Therapeutic Activities    Other Therapeutic Activities Discussed footwear, the importance of maintaining toe in with stretches to avoid overpronation and mechanism of injury/anatomy of the foot/ankle      Exercises    Exercises Ankle      Ankle Exercises: Stretches   Soleus Stretch 4 reps;20 seconds    Soleus Stretch Limitations Slight toe in    Gastroc Stretch 4 reps;20 seconds    Gastroc Stretch Limitations Slight toe in    Slant Board Stretch 3 reps;60 seconds;Limitations    Slant Board Stretch Limitations Knees straight and bent slight toe in                     PT Education - 05/23/21 1007     Education Details Reviewed ankle anatomy including mechanism of injury (overuse, weakness and tight Achilles).  Reviewed day 1 HEP.    Person(s) Educated Patient    Methods Explanation;Demonstration;Tactile cues;Verbal cues;Handout    Comprehension Verbalized understanding;Tactile cues required;Need further instruction;Returned demonstration;Verbal cues required              PT Short Term Goals - 05/23/21 1013       PT SHORT TERM GOAL #1   Title Heidi Crawford will improve B heel cords flexibility to 8 degrees.    Baseline 4 degrees    Time 4    Period Weeks    Status New    Target Date 06/20/21               PT Long Term Goals - 05/23/21 1013       PT LONG TERM GOAL #1   Title Improve FOTO to 74 in 12 visits.    Baseline 60    Time 8    Period Weeks    Status New    Target Date 07/18/21      PT LONG TERM GOAL #2   Title Heidi Crawford will report minimal heel pain/discomfort (0-2/10) with descending stairs.    Baseline Has to take stairs 1 at a time and avoids stairs.    Time 8    Period Weeks    Status New    Target Date 07/18/21      PT LONG TERM GOAL #3   Title Improve B heel cords flexibility to 12 degrees.    Baseline 4 degrees.    Time 8  Period Weeks    Status New    Target Date 07/18/21      PT LONG TERM GOAL #4   Title Improve B ankle strength for inversion/eversion to 40+ degrees.    Baseline 20-35 degrees.    Time 8    Period Weeks    Status New    Target Date 07/18/21      PT LONG TERM GOAL #5   Title Heidi Crawford will be independent with her long-term HEP at  DC.    Baseline Given today.    Time 8    Period Weeks    Status New    Target Date 07/18/21                    Plan - 05/23/21 1009     Clinical Impression Statement Heidi Crawford has tight Achilles bilaterally.  She also has a Haglund's deformity that makes shoes that rub her posterior calcaneous uncomfortable.  We discussed the mechanism of injury and the importance of physical therapy to address tight and weak heel cords.  Her prognosis to improve with function (including stairs) is very good.  She will always have to be careful with correct footwear to avoid rubbing of her posterior heel.    Personal Factors and Comorbidities Time since onset of injury/illness/exacerbation    Examination-Activity Limitations Stairs    Examination-Participation Restrictions Community Activity    Stability/Clinical Decision Making Stable/Uncomplicated    Clinical Decision Making Low    Rehab Potential Good    PT Frequency 1x / week    PT Duration 8 weeks    PT Treatment/Interventions ADLs/Self Care Home Management;Cryotherapy;Iontophoresis 4mg /ml Dexamethasone;Gait training;Stair training;Therapeutic activities;Neuromuscular re-education;Therapeutic exercise;Patient/family education;Orthotic Fit/Training;Manual techniques;Vasopneumatic Device    PT Next Visit Plan Review HEP (toe in with HC stretches), add gastroc and inversion/eversion ankle strengthening    PT Home Exercise Plan Access Code: [email or web address]    Consulted and Agree with Plan of Care Patient             Patient will benefit from skilled therapeutic intervention in order to improve the following deficits and impairments:  Decreased endurance, Decreased mobility, Decreased range of motion, Decreased strength, Difficulty walking, Increased edema, Impaired flexibility, Impaired perceived functional ability, Pain  Visit Diagnosis: Difficulty walking  Muscle weakness (generalized)  Stiffness of left ankle, not elsewhere classified  Pain  in left ankle and joints of left foot  Localized edema     Problem List Patient Active Problem List   Diagnosis Date Noted   GERD (gastroesophageal reflux disease) 05/20/2019   Hyperlipidemia 05/04/2012   Essential hypertension 03/05/2009   COUGH 12/27/2007   ALLERGIC RHINITIS 12/03/2007   DEPENDENT EDEMA 11/10/2007   TMJ PAIN 03/04/2007   HEADACHE 03/04/2007    Farley Ly, PT, MPT 05/23/2021, 10:19 AM  [facility] Physical Therapy [address] [place], [place], [address] Phone: [phone]   Fax:  [phone]  Name: Heidi Crawford MRN: [id number] Date of Birth: [birth year]

## 2021-05-23 NOTE — Patient Instructions (Signed)
Access Code: [email or web address] URL: [email or web address] Date: 05/23/2021 Prepared by: Vista Mink  Exercises Slant Board Gastrocnemius Stretch - 2-3 x daily - 7 x weekly - 1 sets - 3 reps - 60 hold Slant Board Soleus Stretch - 2-3 x daily - 7 x weekly - 1 sets - 3 reps - 60 hold Standing Gastroc Stretch - 2-3 x daily - 7 x weekly - 1 sets - 4-5 reps - 20 seconds hold Standing Soleus Stretch - 2-3 x daily - 7 x weekly - 1 sets - 4-5 reps - 20 seconds hold

## 2021-05-27 ENCOUNTER — Other Ambulatory Visit: Payer: Self-pay

## 2021-05-27 ENCOUNTER — Encounter: Payer: Self-pay | Admitting: Rehabilitative and Restorative Service Providers"

## 2021-05-27 ENCOUNTER — Ambulatory Visit (INDEPENDENT_AMBULATORY_CARE_PROVIDER_SITE_OTHER): Payer: BC Managed Care – PPO | Admitting: Rehabilitative and Restorative Service Providers"

## 2021-05-27 DIAGNOSIS — M6281 Muscle weakness (generalized): Secondary | ICD-10-CM

## 2021-05-27 DIAGNOSIS — M25572 Pain in left ankle and joints of left foot: Secondary | ICD-10-CM

## 2021-05-27 DIAGNOSIS — M25672 Stiffness of left ankle, not elsewhere classified: Secondary | ICD-10-CM | POA: Diagnosis not present

## 2021-05-27 DIAGNOSIS — R262 Difficulty in walking, not elsewhere classified: Secondary | ICD-10-CM

## 2021-05-27 DIAGNOSIS — R600 Localized edema: Secondary | ICD-10-CM

## 2021-05-27 NOTE — Therapy (Signed)
[facility] Physical Therapy [address] [place], [place], [address] Phone: [phone]   Fax:  [phone]  Physical Therapy Treatment  Patient Details  Name: Heidi Crawford MRN: [id number] Date of Birth: Jan 10, 1961 Referring Provider (PT): Bevely Palmer Persons PA   Encounter Date: 05/27/2021   PT End of Session - 05/27/21 1026     Visit Number 2    Number of Visits 12    Date for PT Re-Evaluation 07/11/21    Progress Note Due on Visit 12    PT Start Time 1015    PT Stop Time 1054    PT Time Calculation (min) 39 min    Activity Tolerance Patient tolerated treatment well    Behavior During Therapy [facility] for tasks assessed/performed             Past Medical History:  Diagnosis Date   Allergy    Dependent edema    GERD (gastroesophageal reflux disease)    Headache(784.0)    Hyperlipidemia    Hypertension    Squamous cell cancer of skin of forearm    right arm    TMJ pain dysfunction syndrome     Past Surgical History:  Procedure Laterality Date   COLONOSCOPY  09/08/2014   per Dr. Henrene Pastor, clear, repeat in 10 yrs    DENTAL SURGERY     removal skin cancer  2014   from right arm per Dr. Delman Cheadle     There were no vitals filed for this visit.   Subjective Assessment - 05/27/21 1017     Subjective Pt. indicated feeling better overall.  Pt. indicated no pain upon arrival today.  Rated pain at worst at 5/10 since last visit.    Pertinent History No    Limitations Walking;Lifting;House hold activities;Standing    Patient Stated Goals Be able to descend stairs without limitations and avoid surgery    Currently in Pain? No/denies    Pain Score 5    at worst   Pain Location Heel    Pain Orientation Left    Pain Descriptors / Indicators Burning    Pain Type Acute pain    Pain Onset 1 to 4 weeks ago    Pain Frequency Intermittent    Aggravating Factors  going down stairs    Pain Relieving Factors early HEP has helped                [facility] PT  Assessment - 05/27/21 0001       Strength   Left Ankle Plantar Flexion 3/5   Able to perform single rep on Lt leg                          [facility] Adult PT Treatment/Exercise - 05/27/21 0001       Exercises   Exercises Other Exercises      Manual Therapy   Manual therapy comments g4 ap talocrural joint mobilizations Lt ankle      Ankle Exercises: Standing   Other Standing Ankle Exercises green band inversion, eversion Lt ankle 2 x 10    Other Standing Ankle Exercises PF 2 leg up, 75%-100% WB lowering on Lt x 10      Ankle Exercises: Stretches   Soleus Stretch 60 seconds;2 reps   bilateral (one leg at at time) on incline board.  Adjusted to one at a time due to difficulty and awkwardness of stretching.  Encouraged hand assist.   Soleus Stretch Limitations to one  at a time due to difficulty and awkwardness of stretching.  Encouraged hand assist.    Gastroc Stretch 60 seconds;3 reps   incilne board double leg   Gastroc Stretch Limitations Slight toe in    Other Stretch DF on step in lunge 2 x 10 Lt                     PT Education - 05/27/21 1045     Education Details HEP adjustments per Pt. request, cues for new intervention    Person(s) Educated Patient    Methods Explanation;Demonstration;Verbal cues;Handout    Comprehension Returned demonstration;Verbalized understanding              PT Short Term Goals - 05/27/21 1026       PT SHORT TERM GOAL #1   Title Heidi Crawford will improve B heel cords flexibility to 8 degrees.    Baseline 4 degrees    Time 4    Period Weeks    Status On-going    Target Date 06/20/21               PT Long Term Goals - 05/23/21 1013       PT LONG TERM GOAL #1   Title Improve FOTO to 74 in 12 visits.    Baseline 60    Time 8    Period Weeks    Status New    Target Date 07/18/21      PT LONG TERM GOAL #2   Title Heidi Crawford will report minimal heel pain/discomfort (0-2/10) with descending stairs.    Baseline Has  to take stairs 1 at a time and avoids stairs.    Time 8    Period Weeks    Status New    Target Date 07/18/21      PT LONG TERM GOAL #3   Title Improve B heel cords flexibility to 12 degrees.    Baseline 4 degrees.    Time 8    Period Weeks    Status New    Target Date 07/18/21      PT LONG TERM GOAL #4   Title Improve B ankle strength for inversion/eversion to 40+ degrees.    Baseline 20-35 degrees.    Time 8    Period Weeks    Status New    Target Date 07/18/21      PT LONG TERM GOAL #5   Title Heidi Crawford will be independent with her long-term HEP at DC.    Baseline Given today.    Time 8    Period Weeks    Status New    Target Date 07/18/21                   Plan - 05/27/21 1027     Clinical Impression Statement Mild restriction in talocrural joint mobility assessment today.  Reviewed existing HEP c occasional cues for techniques and answers to questions from Pt. about performance.  Overall indications of positive start ot treatment at this point.  Continued skilled PT services warranted at this time.    Personal Factors and Comorbidities Time since onset of injury/illness/exacerbation    Examination-Activity Limitations Stairs    Examination-Participation Restrictions Community Activity    Stability/Clinical Decision Making Stable/Uncomplicated    Rehab Potential Good    PT Frequency 1x / week    PT Duration 8 weeks    PT Treatment/Interventions ADLs/Self Care Home Management;Cryotherapy;Iontophoresis 4mg /ml Dexamethasone;Gait training;Stair training;Therapeutic activities;Neuromuscular re-education;Therapeutic exercise;Patient/family education;Orthotic Fit/Training;Manual  techniques;Vasopneumatic Device    PT Next Visit Plan Progressive strengthening program continued, DF mobility gains for ambulation and stair navigation mobility.    PT Home Exercise Plan Access Code: [email or web address]    Consulted and Agree with Plan of Care Patient             Patient will  benefit from skilled therapeutic intervention in order to improve the following deficits and impairments:  Decreased endurance, Decreased mobility, Decreased range of motion, Decreased strength, Difficulty walking, Increased edema, Impaired flexibility, Impaired perceived functional ability, Pain  Visit Diagnosis: Difficulty walking  Muscle weakness (generalized)  Stiffness of left ankle, not elsewhere classified  Pain in left ankle and joints of left foot  Localized edema     Problem List Patient Active Problem List   Diagnosis Date Noted   GERD (gastroesophageal reflux disease) 05/20/2019   Hyperlipidemia 05/04/2012   Essential hypertension 03/05/2009   COUGH 12/27/2007   ALLERGIC RHINITIS 12/03/2007   DEPENDENT EDEMA 11/10/2007   TMJ PAIN 03/04/2007   HEADACHE 03/04/2007   Scot Jun, PT, DPT, OCS, ATC 05/27/21  10:53 AM    [facility] Physical Therapy [address] [place], [place], [address] Phone: [phone]   Fax:  [phone]  Name: Heidi Crawford MRN: [id number] Date of Birth: 04/06/1961

## 2021-05-27 NOTE — Patient Instructions (Signed)
Access Code: [email or web address] URL: [email or web address] Date: 05/27/2021 Prepared by: Scot Jun  Exercises Slant Board Gastrocnemius Stretch - 2-3 x daily - 7 x weekly - 1 sets - 3 reps - 60 hold Slant Board Soleus Stretch - 2-3 x daily - 7 x weekly - 1 sets - 3 reps - 60 hold Standing Gastroc Stretch - 2-3 x daily - 7 x weekly - 1 sets - 4-5 reps - 20 seconds hold Standing Soleus Stretch - 2-3 x daily - 7 x weekly - 1 sets - 4-5 reps - 20 seconds hold Standing Dorsiflexion Self-Mobilization on Step (Mirrored) - 2 x daily - 7 x weekly - 1-2 sets - 10 reps - 2-3 hold

## 2021-06-05 ENCOUNTER — Ambulatory Visit (INDEPENDENT_AMBULATORY_CARE_PROVIDER_SITE_OTHER): Payer: BC Managed Care – PPO | Admitting: Rehabilitative and Restorative Service Providers"

## 2021-06-05 ENCOUNTER — Other Ambulatory Visit: Payer: Self-pay

## 2021-06-05 ENCOUNTER — Encounter: Payer: Self-pay | Admitting: Rehabilitative and Restorative Service Providers"

## 2021-06-05 DIAGNOSIS — M25672 Stiffness of left ankle, not elsewhere classified: Secondary | ICD-10-CM

## 2021-06-05 DIAGNOSIS — R262 Difficulty in walking, not elsewhere classified: Secondary | ICD-10-CM

## 2021-06-05 DIAGNOSIS — M6281 Muscle weakness (generalized): Secondary | ICD-10-CM

## 2021-06-05 DIAGNOSIS — R600 Localized edema: Secondary | ICD-10-CM

## 2021-06-05 DIAGNOSIS — M25572 Pain in left ankle and joints of left foot: Secondary | ICD-10-CM

## 2021-06-05 NOTE — Therapy (Signed)
[facility] Physical Therapy [address] [place], [place], [address] Phone: [phone]   Fax:  [phone]  Physical Therapy Treatment  Patient Details  Name: Heidi Crawford MRN: [id number] Date of Birth: 04-Jul-1961 Referring Provider (PT): Bevely Palmer Persons PA   Encounter Date: 06/05/2021   PT End of Session - 06/05/21 1806     Visit Number 3    Number of Visits 12    Date for PT Re-Evaluation 07/11/21    Progress Note Due on Visit 12    PT Start Time 1146    PT Stop Time 1228    PT Time Calculation (min) 42 min    Activity Tolerance Patient tolerated treatment well;No increased pain    Behavior During Therapy [facility] for tasks assessed/performed             Past Medical History:  Diagnosis Date   Allergy    Dependent edema    GERD (gastroesophageal reflux disease)    Headache(784.0)    Hyperlipidemia    Hypertension    Squamous cell cancer of skin of forearm    right arm    TMJ pain dysfunction syndrome     Past Surgical History:  Procedure Laterality Date   COLONOSCOPY  09/08/2014   per Dr. Henrene Pastor, clear, repeat in 10 yrs    DENTAL SURGERY     removal skin cancer  2014   from right arm per Dr. Delman Cheadle     There were no vitals filed for this visit.   Subjective Assessment - 06/05/21 1154     Subjective Francene notes better pain overall.  Ladders and stairs are challenging.    Pertinent History No    Limitations Walking;Lifting;House hold activities;Standing    How long can you sit comfortably? No problem with sitting    How long can you stand comfortably? Unlimited if no stairs or hills    How long can you walk comfortably? Descending stairs is limited    Patient Stated Goals Be able to descend stairs without limitations and avoid surgery    Currently in Pain? Yes    Pain Score 2     Pain Location Heel    Pain Orientation Left;Posterior    Pain Descriptors / Indicators Burning    Pain Type Acute pain    Pain Radiating Towards NA     Pain Onset More than a month ago    Pain Frequency Intermittent    Aggravating Factors  Descending stairs and hills    Pain Relieving Factors HEP    Effect of Pain on Daily Activities Limits and hills    Multiple Pain Sites No                               [facility] Adult PT Treatment/Exercise - 06/05/21 0001       Therapeutic Activites    Therapeutic Activities Other Therapeutic Activities    Other Therapeutic Activities Reviewed footwear discussion and technique with stretches during TE      Neuro Re-ed    Neuro Re-ed Details  Tandem balance eyes open/head turning/eyes closed 2X 30 seconds each      Exercises   Exercises Ankle      Ankle Exercises: Stretches   Soleus Stretch 4 reps;20 seconds    Soleus Stretch Limitations Slight toe in    Gastroc Stretch 4 reps;20 seconds    Gastroc Stretch Limitations Slight toe in    [employer]  Stretch 3 reps;60 seconds;Limitations    Slant Board Stretch Limitations Knees straight and bent slight toe in      Ankle Exercises: Standing   Heel Raises Both;5 reps;3 seconds;Limitations    Heel Raises Limitations 3 sets slow eccentrics    Other Standing Ankle Exercises Green theraband inv/ev 10X each B slow eccentrics                     PT Education - 06/05/21 1806     Education Details Reviewed footwear and HEP with appropriate feedback and progressions.    Person(s) Educated Patient    Methods Explanation;Demonstration;Tactile cues;Verbal cues;Handout    Comprehension Verbalized understanding;Tactile cues required;Need further instruction;Returned demonstration;Verbal cues required              PT Short Term Goals - 05/27/21 1026       PT SHORT TERM GOAL #1   Title Zuleyka will improve B heel cords flexibility to 8 degrees.    Baseline 4 degrees    Time 4    Period Weeks    Status On-going    Target Date 06/20/21               PT Long Term Goals - 05/23/21 1013       PT LONG TERM GOAL #1    Title Improve FOTO to 74 in 12 visits.    Baseline 60    Time 8    Period Weeks    Status New    Target Date 07/18/21      PT LONG TERM GOAL #2   Title Catherin will report minimal heel pain/discomfort (0-2/10) with descending stairs.    Baseline Has to take stairs 1 at a time and avoids stairs.    Time 8    Period Weeks    Status New    Target Date 07/18/21      PT LONG TERM GOAL #3   Title Improve B heel cords flexibility to 12 degrees.    Baseline 4 degrees.    Time 8    Period Weeks    Status New    Target Date 07/18/21      PT LONG TERM GOAL #4   Title Improve B ankle strength for inversion/eversion to 40+ degrees.    Baseline 20-35 degrees.    Time 8    Period Weeks    Status New    Target Date 07/18/21      PT LONG TERM GOAL #5   Title Fonda will be independent with her long-term HEP at DC.    Baseline Given today.    Time 8    Period Weeks    Status New    Target Date 07/18/21                   Plan - 06/05/21 1807     Clinical Impression Statement Kalla reports early subjective progress as she now has little troublw with normal walking.  Declines and stairs are improving but still functionally limiting.  This is consistent with AROM and strength impairments noted at evaluation.  Continue her current POC with RA in the next 1-2 weeks to make additional recommendations.    Personal Factors and Comorbidities Time since onset of injury/illness/exacerbation    Examination-Activity Limitations Stairs    Examination-Participation Restrictions Community Activity    Stability/Clinical Decision Making Stable/Uncomplicated    Rehab Potential Good    PT Frequency 1x / week  PT Duration 8 weeks    PT Treatment/Interventions ADLs/Self Care Home Management;Cryotherapy;Iontophoresis 4mg /ml Dexamethasone;Gait training;Stair training;Therapeutic activities;Neuromuscular re-education;Therapeutic exercise;Patient/family education;Orthotic Fit/Training;Manual  techniques;Vasopneumatic Device    PT Next Visit Plan DF AROM, ankle strength, balance and functional (stair) progressions    PT Home Exercise Plan Access Code: [email or web address]    Consulted and Agree with Plan of Care Patient             Patient will benefit from skilled therapeutic intervention in order to improve the following deficits and impairments:  Decreased endurance, Decreased mobility, Decreased range of motion, Decreased strength, Difficulty walking, Increased edema, Impaired flexibility, Impaired perceived functional ability, Pain  Visit Diagnosis: Difficulty walking  Muscle weakness (generalized)  Stiffness of left ankle, not elsewhere classified  Localized edema  Pain in left ankle and joints of left foot     Problem List Patient Active Problem List   Diagnosis Date Noted   GERD (gastroesophageal reflux disease) 05/20/2019   Hyperlipidemia 05/04/2012   Essential hypertension 03/05/2009   COUGH 12/27/2007   ALLERGIC RHINITIS 12/03/2007   DEPENDENT EDEMA 11/10/2007   TMJ PAIN 03/04/2007   HEADACHE 03/04/2007    Farley Ly, PT, MPT 06/05/2021, 6:10 PM  [facility] [address] [place], [place], [address] Phone: [phone]   Fax:  [phone]  Name: Hedaya Latendresse MRN: [id number] Date of Birth: [birth year]

## 2021-06-05 NOTE — Patient Instructions (Signed)
Access Code: [email or web address] URL: [email or web address] Date: 06/05/2021 Prepared by: Vista Mink  Exercises Slant Board Gastrocnemius Stretch - 2-3 x daily - 7 x weekly - 1 sets - 3 reps - 60 hold Slant Board Soleus Stretch - 2-3 x daily - 7 x weekly - 1 sets - 3 reps - 60 hold Standing Gastroc Stretch - 2-3 x daily - 7 x weekly - 1 sets - 4-5 reps - 20 seconds hold Standing Soleus Stretch - 2-3 x daily - 7 x weekly - 1 sets - 4-5 reps - 20 seconds hold Standing Dorsiflexion Self-Mobilization on Step (Mirrored) - 2 x daily - 7 x weekly - 1-2 sets - 10 reps - 2-3 hold Ankle Inversion with Resistance - 1 x daily - 3 x weekly - 1 sets - 10-20 reps Ankle Eversion with Resistance - 1 x daily - 3 x weekly - 1 sets - 10-20 reps Tandem Stance - 1 x daily - 3 x weekly - 1 sets - 5 reps - 20-30 second hold Heel Raise - 3-5 x daily - 7 x weekly - 1 sets - 5-10 reps - 3 seconds hold

## 2021-06-11 ENCOUNTER — Encounter: Payer: Self-pay | Admitting: Family Medicine

## 2021-06-11 ENCOUNTER — Ambulatory Visit (INDEPENDENT_AMBULATORY_CARE_PROVIDER_SITE_OTHER): Payer: BC Managed Care – PPO | Admitting: Family Medicine

## 2021-06-11 VITALS — BP 128/80 | HR 78 | Temp 97.8°F | Ht 62.0 in | Wt 190.0 lb

## 2021-06-11 DIAGNOSIS — Z0000 Encounter for general adult medical examination without abnormal findings: Secondary | ICD-10-CM

## 2021-06-11 LAB — CBC WITH DIFFERENTIAL/PLATELET
Basophils Absolute: 0.1 10*3/uL (ref 0.0–0.1)
Basophils Relative: 1 % (ref 0.0–3.0)
Eosinophils Absolute: 0.2 10*3/uL (ref 0.0–0.7)
Eosinophils Relative: 3 % (ref 0.0–5.0)
HCT: 42.2 % (ref 36.0–46.0)
Hemoglobin: 14.2 g/dL (ref 12.0–15.0)
Lymphocytes Relative: 24.8 % (ref 12.0–46.0)
Lymphs Abs: 1.4 10*3/uL (ref 0.7–4.0)
MCHC: 33.6 g/dL (ref 30.0–36.0)
MCV: 88.7 fl (ref 78.0–100.0)
Monocytes Absolute: 0.4 10*3/uL (ref 0.1–1.0)
Monocytes Relative: 6.9 % (ref 3.0–12.0)
Neutro Abs: 3.6 10*3/uL (ref 1.4–7.7)
Neutrophils Relative %: 64.3 % (ref 43.0–77.0)
Platelets: 258 10*3/uL (ref 150.0–400.0)
RBC: 4.76 Mil/uL (ref 3.87–5.11)
RDW: 12.9 % (ref 11.5–15.5)
WBC: 5.5 10*3/uL (ref 4.0–10.5)

## 2021-06-11 LAB — BASIC METABOLIC PANEL
BUN: 12 mg/dL (ref 6–23)
CO2: 30 mEq/L (ref 19–32)
Calcium: 9.4 mg/dL (ref 8.4–10.5)
Chloride: 103 mEq/L (ref 96–112)
Creatinine, Ser: 0.79 mg/dL (ref 0.40–1.20)
GFR: 81.17 mL/min (ref >60.00)
Glucose, Bld: 96 mg/dL (ref 70–99)
Potassium: 3.9 mEq/L (ref 3.5–5.1)
Sodium: 141 mEq/L (ref 135–145)

## 2021-06-11 LAB — HEPATIC FUNCTION PANEL
ALT: 14 U/L (ref 0–35)
AST: 19 U/L (ref 0–37)
Albumin: 4.4 g/dL (ref 3.5–5.2)
Alkaline Phosphatase: 102 U/L (ref 39–117)
Bilirubin, Direct: 0.1 mg/dL (ref 0.0–0.3)
Total Bilirubin: 0.7 mg/dL (ref 0.2–1.2)
Total Protein: 7.3 g/dL (ref 6.0–8.3)

## 2021-06-11 LAB — LIPID PANEL
Cholesterol: 156 mg/dL (ref 0–200)
HDL: 61 mg/dL (ref >39.00)
LDL Cholesterol: 83 mg/dL (ref 0–99)
NonHDL: 95.18
Total CHOL/HDL Ratio: 3
Triglycerides: 61 mg/dL (ref 0.0–149.0)
VLDL: 12.2 mg/dL (ref 0.0–40.0)

## 2021-06-11 LAB — TSH: TSH: 1.18 u[IU]/mL (ref 0.35–5.50)

## 2021-06-11 LAB — HEMOGLOBIN A1C: Hgb A1c MFr Bld: 5.4 % (ref 4.6–6.5)

## 2021-06-11 MED ORDER — OMEPRAZOLE 40 MG PO CPDR: 40.0000 mg | DELAYED_RELEASE_CAPSULE | Freq: Every day | ORAL | 3 refills | Status: DC

## 2021-06-11 NOTE — Addendum Note (Signed)
Addended by: Alysia Penna A on: 06/11/2021 10:46 AM   Modules accepted: Orders

## 2021-06-11 NOTE — Progress Notes (Signed)
   Subjective:    Patient ID: Heidi Crawford, female    DOB: 03/08/1961, 60 y.o.   MRN: [id number]  HPI Here for a well exam. She is doing well.    Review of Systems  Constitutional: Negative.   HENT: Negative.    Eyes: Negative.   Respiratory: Negative.    Cardiovascular: Negative.   Gastrointestinal: Negative.   Genitourinary:  Negative for decreased urine volume, difficulty urinating, dyspareunia, dysuria, enuresis, flank pain, frequency, hematuria, pelvic pain and urgency.  Musculoskeletal: Negative.   Skin: Negative.   Neurological: Negative.  Negative for headaches.  Psychiatric/Behavioral: Negative.        Objective:   Physical Exam Constitutional:      General: She is not in acute distress.    Appearance: Normal appearance. She is well-developed.  HENT:     Head: Normocephalic and atraumatic.     Right Ear: External ear normal.     Left Ear: External ear normal.     Nose: Nose normal.     Mouth/Throat:     Pharynx: No oropharyngeal exudate.  Eyes:     General: No scleral icterus.    Conjunctiva/sclera: Conjunctivae normal.     Pupils: Pupils are equal, round, and reactive to light.  Neck:     Thyroid: No thyromegaly.     Vascular: No JVD.  Cardiovascular:     Rate and Rhythm: Normal rate and regular rhythm.     Heart sounds: Normal heart sounds. No murmur heard.   No friction rub. No gallop.  Pulmonary:     Effort: Pulmonary effort is normal. No respiratory distress.     Breath sounds: Normal breath sounds. No wheezing or rales.  Chest:     Chest wall: No tenderness.  Abdominal:     General: Bowel sounds are normal. There is no distension.     Palpations: Abdomen is soft. There is no mass.     Tenderness: There is no abdominal tenderness. There is no guarding or rebound.  Musculoskeletal:        General: No tenderness. Normal range of motion.     Cervical back: Normal range of motion and neck supple.  Lymphadenopathy:     Cervical: No cervical adenopathy.   Skin:    General: Skin is warm and dry.     Findings: No erythema or rash.  Neurological:     Mental Status: She is alert and oriented to person, place, and time.     Cranial Nerves: No cranial nerve deficit.     Motor: No abnormal muscle tone.     Coordination: Coordination normal.     Deep Tendon Reflexes: Reflexes are normal and symmetric. Reflexes normal.  Psychiatric:        Behavior: Behavior normal.        Thought Content: Thought content normal.        Judgment: Judgment normal.          Assessment & Plan:  Well exam. We discussed diet and exercise. Get fasting labs.  Alysia Penna, MD

## 2021-06-12 ENCOUNTER — Encounter: Payer: Self-pay | Admitting: Rehabilitative and Restorative Service Providers"

## 2021-06-12 ENCOUNTER — Other Ambulatory Visit: Payer: Self-pay

## 2021-06-12 ENCOUNTER — Ambulatory Visit (INDEPENDENT_AMBULATORY_CARE_PROVIDER_SITE_OTHER): Payer: BC Managed Care – PPO | Admitting: Rehabilitative and Restorative Service Providers"

## 2021-06-12 DIAGNOSIS — M6281 Muscle weakness (generalized): Secondary | ICD-10-CM

## 2021-06-12 DIAGNOSIS — M25672 Stiffness of left ankle, not elsewhere classified: Secondary | ICD-10-CM | POA: Diagnosis not present

## 2021-06-12 DIAGNOSIS — M25572 Pain in left ankle and joints of left foot: Secondary | ICD-10-CM | POA: Diagnosis not present

## 2021-06-12 DIAGNOSIS — R262 Difficulty in walking, not elsewhere classified: Secondary | ICD-10-CM | POA: Diagnosis not present

## 2021-06-12 DIAGNOSIS — R600 Localized edema: Secondary | ICD-10-CM

## 2021-06-12 NOTE — Therapy (Signed)
[facility] [address] [place], [place], [address] Phone: [phone]   Fax:  [phone]  Physical Therapy Treatment/Progress Note  Patient Details  Name: Heidi Crawford MRN: [id number] Date of Birth: 04-03-1961 Referring Provider (PT): Bevely Palmer Persons PA  Progress Note Reporting Period 05/23/2021 to 06/12/2021  See note below for Objective Data and Assessment of Progress/Goals.     Encounter Date: 06/12/2021   PT End of Session - 06/12/21 1242     Visit Number 4    Number of Visits 12    Date for PT Re-Evaluation 07/11/21    Progress Note Due on Visit 12    PT Start Time 7035    PT Stop Time 1231    PT Time Calculation (min) 46 min    Activity Tolerance Patient tolerated treatment well;No increased pain    Behavior During Therapy WFL for tasks assessed/performed             Past Medical History:  Diagnosis Date   Allergy    Dependent edema    GERD (gastroesophageal reflux disease)    Headache(784.0)    Hyperlipidemia    Hypertension    Squamous cell cancer of skin of forearm    right arm    TMJ pain dysfunction syndrome     Past Surgical History:  Procedure Laterality Date   COLONOSCOPY  09/08/2014   per Dr. Henrene Pastor, clear, repeat in 10 yrs    DENTAL SURGERY     removal skin cancer  2014   from right arm per Dr. Delman Cheadle     There were no vitals filed for this visit.   Subjective Assessment - 06/12/21 1200     Subjective Descending stairs remains the most functionally limiting.  Otherwise, she is doing much better unless walking a lot.    Pertinent History No    Limitations Walking;Lifting;House hold activities;Standing    How long can you sit comfortably? No problem with sitting    How long can you stand comfortably? Unlimited if no stairs or hills, at least 2 miles.    How long can you walk comfortably? Descending stairs is limited    Patient Stated Goals Be able to descend stairs without limitations and avoid  surgery    Currently in Pain? Yes    Pain Score 1     Pain Location Heel    Pain Orientation Left;Posterior    Pain Descriptors / Indicators Tightness    Pain Type Acute pain    Pain Radiating Towards NA    Pain Onset More than a month ago    Pain Frequency Intermittent    Aggravating Factors  Descending stairs and prolonged walking.    Pain Relieving Factors HEP    Effect of Pain on Daily Activities Limits endurance and descending stairs    Multiple Pain Sites No                [facility] PT Assessment - 06/12/21 0001       ROM / Strength   AROM / PROM / Strength AROM;Strength      AROM   Overall AROM  Deficits    AROM Assessment Site Ankle    Right/Left Ankle Left;Right    Right Ankle Dorsiflexion 8    Left Ankle Dorsiflexion 8      Strength   Overall Strength Deficits    Strength Assessment Site Ankle    Right/Left Ankle Left;Right    Right Ankle Inversion --   27.3  Right Ankle Eversion --   39.5 pounds   Left Ankle Inversion --   31.9 pounds   Left Ankle Eversion --   34.3 pounds                          [facility] Adult PT Treatment/Exercise - 06/12/21 0001       Neuro Re-ed    Neuro Re-ed Details  Tandem balance with gradual narrowing of stance 8X 20 seconds      Exercises   Exercises Ankle      Ankle Exercises: Stretches   Soleus Stretch 1 rep;20 seconds;Limitations    Soleus Stretch Limitations Slight toe in    Gastroc Stretch 1 rep;20 seconds;Limitations    Gastroc Stretch Limitations Slight toe in    Slant Board Stretch 3 reps;60 seconds;Limitations    Slant Board Stretch Limitations Knees straight and bent slight toe in 3X each      Ankle Exercises: Standing   Heel Raises Both;10 reps;3 seconds;Limitations    Heel Raises Limitations 5 sets slow eccentrics    Other Standing Ankle Exercises Green theraband inv/ev 10X each B slow eccentrics                     PT Education - 06/12/21 1238     Education Details  Corrective feedback with HEP (particularly soleus stretch) and review RA findings.    Person(s) Educated Patient    Methods Explanation;Demonstration;Tactile cues;Verbal cues;Handout    Comprehension Tactile cues required;Verbalized understanding;Returned demonstration;Need further instruction;Verbal cues required              PT Short Term Goals - 06/12/21 1239       PT SHORT TERM GOAL #1   Title Heidi Crawford will improve B heel cords flexibility to 8 degrees.    Baseline 4 degrees at evaluation (8 degrees on 06/12/2021)    Time 4    Period Weeks    Status Achieved    Target Date 06/20/21               PT Long Term Goals - 06/12/21 1240       PT LONG TERM GOAL #1   Title Improve FOTO to 74 in 12 visits.    Baseline 60 at evaluation (67 at 06/12/2021 visit)    Time 8    Period Weeks    Status On-going    Target Date 07/18/21      PT LONG TERM GOAL #2   Title Heidi Crawford will report minimal heel pain/discomfort (0-2/10) with descending stairs.    Baseline Has to take stairs 1 at a time but no longer avoids stairs.    Time 8    Period Weeks    Status On-going    Target Date 07/18/21      PT LONG TERM GOAL #3   Title Improve B heel cords flexibility to 12 degrees.    Baseline 4 degrees at evaluation (now 8 degrees)    Time 8    Period Weeks    Status On-going    Target Date 07/18/21      PT LONG TERM GOAL #4   Title Improve B ankle strength for inversion/eversion to 40+ degrees.    Baseline Improved as compared to eval, short of long-term goals (see objective)    Time 8    Period Weeks    Status On-going    Target Date 07/18/21      PT LONG  TERM GOAL #5   Title Heidi Crawford will be independent with her long-term HEP at DC.    Baseline Updated today    Time 8    Period Weeks    Status On-going    Target Date 07/18/21                   Plan - 06/12/21 1242     Clinical Impression Statement Heidi Crawford is making great early progress towards long-term goals.  Walking  endurance and stairs have improved, although both are limited.  Gains in dorsiflexion AROM, ankle strength (plantar flexion) and self-reported function should continue with the recommended POC.    Personal Factors and Comorbidities Time since onset of injury/illness/exacerbation    Examination-Activity Limitations Stairs    Examination-Participation Restrictions Community Activity    Stability/Clinical Decision Making Stable/Uncomplicated    Clinical Decision Making Low    Rehab Potential Good    PT Frequency 1x / week    PT Duration 4 weeks    PT Treatment/Interventions ADLs/Self Care Home Management;Cryotherapy;Iontophoresis 4mg /ml Dexamethasone;Gait training;Stair training;Therapeutic activities;Neuromuscular re-education;Therapeutic exercise;Patient/family education;Orthotic Fit/Training;Manual techniques;Vasopneumatic Device    PT Next Visit Plan Continue DF AROM, plantar flexors strength, balance and functional (stair) progressions    PT Home Exercise Plan Access Code: [email or web address]    Consulted and Agree with Plan of Care Patient             Patient will benefit from skilled therapeutic intervention in order to improve the following deficits and impairments:  Decreased endurance, Decreased mobility, Decreased range of motion, Decreased strength, Difficulty walking, Increased edema, Impaired flexibility, Impaired perceived functional ability, Pain  Visit Diagnosis: Difficulty walking  Muscle weakness (generalized)  Stiffness of left ankle, not elsewhere classified  Pain in left ankle and joints of left foot  Localized edema     Problem List Patient Active Problem List   Diagnosis Date Noted   GERD (gastroesophageal reflux disease) 05/20/2019   Hyperlipidemia 05/04/2012   Essential hypertension 03/05/2009   COUGH 12/27/2007   ALLERGIC RHINITIS 12/03/2007   DEPENDENT EDEMA 11/10/2007   TMJ PAIN 03/04/2007   HEADACHE 03/04/2007    Farley Ly, PT, MPT 06/12/2021, 1:05  PM  [facility] [address] [place], [place], [address] Phone: [phone]   Fax:  [phone]  Name: Heidi Crawford MRN: [id number] Date of Birth: 27-Jul-1960

## 2021-06-12 NOTE — Patient Instructions (Signed)
Continue heel cords box 2X/day (knees straight and bent) and 100 heel raises per day with slow eccentrics

## 2021-06-19 ENCOUNTER — Other Ambulatory Visit: Payer: Self-pay

## 2021-06-19 ENCOUNTER — Ambulatory Visit (INDEPENDENT_AMBULATORY_CARE_PROVIDER_SITE_OTHER): Payer: BC Managed Care – PPO | Admitting: Rehabilitative and Restorative Service Providers"

## 2021-06-19 ENCOUNTER — Encounter: Payer: Self-pay | Admitting: Rehabilitative and Restorative Service Providers"

## 2021-06-19 DIAGNOSIS — M6281 Muscle weakness (generalized): Secondary | ICD-10-CM

## 2021-06-19 DIAGNOSIS — M25572 Pain in left ankle and joints of left foot: Secondary | ICD-10-CM | POA: Diagnosis not present

## 2021-06-19 DIAGNOSIS — M25672 Stiffness of left ankle, not elsewhere classified: Secondary | ICD-10-CM | POA: Diagnosis not present

## 2021-06-19 DIAGNOSIS — R262 Difficulty in walking, not elsewhere classified: Secondary | ICD-10-CM | POA: Diagnosis not present

## 2021-06-19 DIAGNOSIS — R600 Localized edema: Secondary | ICD-10-CM

## 2021-06-19 NOTE — Therapy (Signed)
[facility] [address] [place], [place], [address] Phone: [phone]   Fax:  [phone]  Physical Therapy Treatment  Patient Details  Name: Heidi Crawford MRN: [id number] Date of Birth: 17-Dec-1960 Referring Provider (PT): Bevely Palmer Persons PA   Encounter Date: 06/19/2021   PT End of Session - 06/19/21 1148     Visit Number 5    Number of Visits 12    Date for PT Re-Evaluation 07/11/21    Progress Note Due on Visit 12    PT Start Time 1143    PT Stop Time 1222    PT Time Calculation (min) 39 min    Activity Tolerance Patient limited by pain   Rt great toe   Behavior During Therapy [facility] for tasks assessed/performed             Past Medical History:  Diagnosis Date   Allergy    Dependent edema    GERD (gastroesophageal reflux disease)    Headache(784.0)    Hyperlipidemia    Hypertension    Squamous cell cancer of skin of forearm    right arm    TMJ pain dysfunction syndrome     Past Surgical History:  Procedure Laterality Date   COLONOSCOPY  09/08/2014   per Dr. Henrene Pastor, clear, repeat in 10 yrs    DENTAL SURGERY     removal skin cancer  2014   from right arm per Dr. Delman Cheadle     There were no vitals filed for this visit.   Subjective Assessment - 06/19/21 1145     Subjective Pt. indicated feeling 5/10 at worst c stairs going down.  No pain at rest today.  Pt. indicated having some complaints in Rt great toe c PF activity in last few days.    Pertinent History No    Limitations Walking;Lifting;House hold activities;Standing    How long can you sit comfortably? No problem with sitting    How long can you stand comfortably? Unlimited if no stairs or hills, at least 2 miles.    How long can you walk comfortably? Descending stairs is limited    Patient Stated Goals Be able to descend stairs without limitations and avoid surgery    Currently in Pain? No/denies    Pain Score 0-No pain   no pain at rest   Pain Location Heel     Pain Orientation Left    Pain Onset More than a month ago                               [facility] Adult PT Treatment/Exercise - 06/19/21 0001       Neuro Re-ed    Neuro Re-ed Details  single leg stance on foam Lt leg 30 sec x 4 (hand corrections at times), tandem stance      Ankle Exercises: Standing   Other Standing Ankle Exercises --    Other Standing Ankle Exercises lateral step down WB on Lt 6 inch step 2 x 10      Ankle Exercises: Stretches   Soleus Stretch 3 reps;20 seconds   Lt leg on incilne board   Slant Board Stretch 60 seconds;3 reps      Ankle Exercises: Seated   Other Seated Ankle Exercises blue band inversion, eversion 3 x 10 each Lt leg      Ankle Exercises: Aerobic   Recumbent Bike Lvl 3 6 mins  PT Short Term Goals - 06/12/21 1239       PT SHORT TERM GOAL #1   Title Heidi Crawford will improve B heel cords flexibility to 8 degrees.    Baseline 4 degrees at evaluation (8 degrees on 06/12/2021)    Time 4    Period Weeks    Status Achieved    Target Date 06/20/21               PT Long Term Goals - 06/12/21 1240       PT LONG TERM GOAL #1   Title Improve FOTO to 74 in 12 visits.    Baseline 60 at evaluation (67 at 06/12/2021 visit)    Time 8    Period Weeks    Status On-going    Target Date 07/18/21      PT LONG TERM GOAL #2   Title Heidi Crawford will report minimal heel pain/discomfort (0-2/10) with descending stairs.    Baseline Has to take stairs 1 at a time but no longer avoids stairs.    Time 8    Period Weeks    Status On-going    Target Date 07/18/21      PT LONG TERM GOAL #3   Title Improve B heel cords flexibility to 12 degrees.    Baseline 4 degrees at evaluation (now 8 degrees)    Time 8    Period Weeks    Status On-going    Target Date 07/18/21      PT LONG TERM GOAL #4   Title Improve B ankle strength for inversion/eversion to 40+ degrees.    Baseline Improved as compared to eval, short of  long-term goals (see objective)    Time 8    Period Weeks    Status On-going    Target Date 07/18/21      PT LONG TERM GOAL #5   Title Heidi Crawford will be independent with her long-term HEP at DC.    Baseline Updated today    Time 8    Period Weeks    Status On-going    Target Date 07/18/21                   Plan - 06/19/21 1149     Clinical Impression Statement Cues given today to encourage short term adjustment in interventions to avoid worsening of Rt toe complaints noted (reduced reps of PF for example). Held PF use in clinic today due to those Rt foot complaints.  Othwerise making good gains overall.  Still needs improvement in stair activity and incorporated into intervention today.    Personal Factors and Comorbidities Time since onset of injury/illness/exacerbation    Examination-Activity Limitations Stairs    Examination-Participation Restrictions Community Activity    Stability/Clinical Decision Making Stable/Uncomplicated    Rehab Potential Good    PT Frequency 1x / week    PT Duration 4 weeks    PT Treatment/Interventions ADLs/Self Care Home Management;Cryotherapy;Iontophoresis 4mg /ml Dexamethasone;Gait training;Stair training;Therapeutic activities;Neuromuscular re-education;Therapeutic exercise;Patient/family education;Orthotic Fit/Training;Manual techniques;Vasopneumatic Device    PT Next Visit Plan Eccentric lowering on step    PT Home Exercise Plan Access Code: [email or web address]    Consulted and Agree with Plan of Care Patient             Patient will benefit from skilled therapeutic intervention in order to improve the following deficits and impairments:  Decreased endurance, Decreased mobility, Decreased range of motion, Decreased strength, Difficulty walking, Increased edema, Impaired flexibility, Impaired perceived functional ability, Pain  Visit Diagnosis: Difficulty walking  Muscle weakness (generalized)  Stiffness of left ankle, not elsewhere  classified  Pain in left ankle and joints of left foot  Localized edema     Problem List Patient Active Problem List   Diagnosis Date Noted   GERD (gastroesophageal reflux disease) 05/20/2019   Hyperlipidemia 05/04/2012   Essential hypertension 03/05/2009   COUGH 12/27/2007   ALLERGIC RHINITIS 12/03/2007   DEPENDENT EDEMA 11/10/2007   TMJ PAIN 03/04/2007   HEADACHE 03/04/2007    Scot Jun, PT, DPT, OCS, ATC 06/19/21  12:24 PM    [facility] [address] [place], [place], [address] Phone: [phone]   Fax:  [phone]  Name: Heidi Crawford MRN: [id number] Date of Birth: 12/05/1960

## 2021-06-24 ENCOUNTER — Ambulatory Visit: Payer: BC Managed Care – PPO | Admitting: Orthopedic Surgery

## 2021-06-26 ENCOUNTER — Encounter: Payer: Self-pay | Admitting: Rehabilitative and Restorative Service Providers"

## 2021-06-26 ENCOUNTER — Other Ambulatory Visit: Payer: Self-pay

## 2021-06-26 ENCOUNTER — Ambulatory Visit (INDEPENDENT_AMBULATORY_CARE_PROVIDER_SITE_OTHER): Payer: BC Managed Care – PPO | Admitting: Rehabilitative and Restorative Service Providers"

## 2021-06-26 DIAGNOSIS — M6281 Muscle weakness (generalized): Secondary | ICD-10-CM | POA: Diagnosis not present

## 2021-06-26 DIAGNOSIS — R262 Difficulty in walking, not elsewhere classified: Secondary | ICD-10-CM

## 2021-06-26 DIAGNOSIS — M25572 Pain in left ankle and joints of left foot: Secondary | ICD-10-CM | POA: Diagnosis not present

## 2021-06-26 DIAGNOSIS — M25672 Stiffness of left ankle, not elsewhere classified: Secondary | ICD-10-CM

## 2021-06-26 DIAGNOSIS — R600 Localized edema: Secondary | ICD-10-CM

## 2021-06-26 NOTE — Patient Instructions (Signed)
Continue daily stretching on the slant board (gastroc & soleus), daily heel raises (30-50/day) and balance PRN.

## 2021-06-26 NOTE — Therapy (Signed)
[facility] [address] [place], [place], [address] Phone: [phone]   Fax:  [phone]  Physical Therapy Treatment/Discharge  Patient Details  Name: Heidi Crawford MRN: [id number] Date of Birth: February 17, 1961 Referring Provider (PT): Bevely Palmer Persons PA  PHYSICAL THERAPY DISCHARGE SUMMARY  Visits from Start of Care: 6  Current functional level related to goals / functional outcomes: See note   Remaining deficits: See note   Education / Equipment: Updated HEP   Patient agrees to discharge. Patient goals were met. Patient is being discharged due to being pleased with the current functional level.   Encounter Date: 06/26/2021   PT End of Session - 06/26/21 1652     Visit Number 6    Number of Visits 12    Date for PT Re-Evaluation 07/11/21    Progress Note Due on Visit 12    PT Start Time 1145    PT Stop Time 1228    PT Time Calculation (min) 43 min    Activity Tolerance Patient tolerated treatment well   Rt great toe   Behavior During Therapy WFL for tasks assessed/performed             Past Medical History:  Diagnosis Date   Allergy    Dependent edema    GERD (gastroesophageal reflux disease)    Headache(784.0)    Hyperlipidemia    Hypertension    Squamous cell cancer of skin of forearm    right arm    TMJ pain dysfunction syndrome     Past Surgical History:  Procedure Laterality Date   COLONOSCOPY  09/08/2014   per Dr. Henrene Pastor, clear, repeat in 10 yrs    DENTAL SURGERY     removal skin cancer  2014   from right arm per Dr. Delman Cheadle     There were no vitals filed for this visit.   Subjective Assessment - 06/26/21 1150     Subjective Heidi Crawford notes stairs are better, but not perfect.  1st MTP joint is good (was sore last week).  No more burning sensations.    Pertinent History No    Limitations Walking;Lifting;House hold activities;Standing    How long can you sit comfortably? No problem with sitting    How long can  you stand comfortably? Unlimited if no stairs or hills, at least 2 miles.    How long can you walk comfortably? Descending stairs is limited by 1st MTP joint but improved from evaluation    Patient Stated Goals Be able to descend stairs without limitations and avoid surgery    Currently in Pain? No/denies    Pain Score 0-No pain    Pain Location Heel    Pain Orientation Left    Pain Descriptors / Indicators Tightness    Pain Radiating Towards NA    Pain Onset More than a month ago    Pain Frequency Intermittent    Aggravating Factors  Descending stairs is mildly limited    Pain Relieving Factors HEP    Effect of Pain on Daily Activities Careful with stairs (descending)    Multiple Pain Sites No                [facility] PT Assessment - 06/26/21 0001       ROM / Strength   AROM / PROM / Strength AROM;Strength      AROM   Overall AROM  Deficits    AROM Assessment Site Ankle    Right/Left Ankle Left;Right    Right  Ankle Dorsiflexion 11    Left Ankle Dorsiflexion 11      Strength   Overall Strength Deficits    Strength Assessment Site Ankle    Right/Left Ankle Left;Right    Right Ankle Inversion --   31.0 pounds   Right Ankle Eversion --   42.7 pounds   Left Ankle Inversion --   31.0 pounds   Left Ankle Eversion --   38.6 pounds                          [facility] Adult PT Treatment/Exercise - 06/26/21 0001       Neuro Re-ed    Neuro Re-ed Details  Tandem balance with gradual narrowing of stance: eyes open; head turning and eyes closed 2X 20 seconds each and single leg stance 2X 10 seconds each      Exercises   Exercises Ankle      Ankle Exercises: Stretches   Soleus Stretch --    Soleus Stretch Limitations Slight toe in    Gastroc Stretch Limitations Slight toe in    Slant Board Stretch 60 seconds;3 reps;Limitations   Knees straight (gastroc) and knees bent (soleus)     Ankle Exercises: Standing   Heel Raises Both;10 reps;3 seconds;Limitations     Heel Raises Limitations 3 sets slow eccentrics                     PT Education - 06/26/21 1650     Education Details Reviewed RA findings and updated HEP.  Carime is ready for independent PT.    Person(s) Educated Patient    Methods Explanation;Demonstration;Verbal cues;Handout    Comprehension Verbalized understanding;Returned demonstration;Verbal cues required              PT Short Term Goals - 06/12/21 1239       PT SHORT TERM GOAL #1   Title Heidi Crawford will improve B heel cords flexibility to 8 degrees.    Baseline 4 degrees at evaluation (8 degrees on 06/12/2021)    Time 4    Period Weeks    Status Achieved    Target Date 06/20/21               PT Long Term Goals - 06/26/21 1651       PT LONG TERM GOAL #1   Title Improve FOTO to 74 in 12 visits.    Baseline 60 at evaluation (77 at 06/26/2021 visit)    Time 8    Period Weeks    Status Achieved    Target Date 07/18/21      PT LONG TERM GOAL #2   Title Heidi Crawford will report minimal heel pain/discomfort (0-2/10) with descending stairs.    Baseline Significantly improved    Time 8    Period Weeks    Status Achieved    Target Date 07/18/21      PT LONG TERM GOAL #3   Title Improve B heel cords flexibility to 12 degrees.    Baseline 4 degrees at evaluation (now 11 degrees)    Time 8    Period Weeks    Status On-going    Target Date 07/18/21      PT LONG TERM GOAL #4   Title Improve B ankle strength for inversion/eversion to 40+ degrees.    Baseline Improved as compared to eval, short of long-term goals (see objective)    Time 8    Period Weeks  Status On-going    Target Date 07/18/21      PT LONG TERM GOAL #5   Title Heidi Crawford will be independent with her long-term HEP at DC.    Baseline Updated today    Time 8    Period Weeks    Status Achieved    Target Date 07/18/21                   Plan - 06/26/21 1653     Clinical Impression Statement Heidi Crawford has met or is very close to meeting  all LTGs established at evaluation.  We reviewed her exam findings and HEP with modifications to avoid flaring-up her 1st MTP joint (continue stretching and limited range heel raises).  Heidi Crawford is comfortable with her current HEP which is addressing mild remaining impairments.  She is encouraged to return if progress with her independent PT is not as expected.    Personal Factors and Comorbidities Time since onset of injury/illness/exacerbation    Examination-Activity Limitations Stairs    Examination-Participation Restrictions Community Activity    Stability/Clinical Decision Making Stable/Uncomplicated    Rehab Potential Good    PT Frequency 1x / week    PT Duration 4 weeks    PT Treatment/Interventions ADLs/Self Care Home Management;Cryotherapy;Iontophoresis 4mg /ml Dexamethasone;Gait training;Stair training;Therapeutic activities;Neuromuscular re-education;Therapeutic exercise;Patient/family education;Orthotic Fit/Training;Manual techniques;Vasopneumatic Device    PT Next Visit Plan DC to HEP    PT Home Exercise Plan Access Code: [email or web address]    Consulted and Agree with Plan of Care Patient             Patient will benefit from skilled therapeutic intervention in order to improve the following deficits and impairments:  Decreased endurance, Decreased mobility, Decreased range of motion, Decreased strength, Difficulty walking, Increased edema, Impaired flexibility, Impaired perceived functional ability, Pain  Visit Diagnosis: Difficulty walking  Muscle weakness (generalized)  Stiffness of left ankle, not elsewhere classified  Pain in left ankle and joints of left foot  Localized edema     Problem List Patient Active Problem List   Diagnosis Date Noted   GERD (gastroesophageal reflux disease) 05/20/2019   Hyperlipidemia 05/04/2012   Essential hypertension 03/05/2009   COUGH 12/27/2007   ALLERGIC RHINITIS 12/03/2007   DEPENDENT EDEMA 11/10/2007   TMJ PAIN 03/04/2007   HEADACHE  03/04/2007    Heidi Crawford, PT, MPT 06/26/2021, 4:56 PM  [facility] [address] [place], [place], [address] Phone: [phone]   Fax:  [phone]  Name: Heidi Crawford MRN: [id number] Date of Birth: [birth year]

## 2021-07-03 ENCOUNTER — Encounter: Payer: BC Managed Care – PPO | Admitting: Rehabilitative and Restorative Service Providers"

## 2021-07-04 ENCOUNTER — Ambulatory Visit: Payer: BC Managed Care – PPO | Admitting: Orthopedic Surgery

## 2021-07-23 ENCOUNTER — Encounter: Payer: Self-pay | Admitting: Family Medicine

## 2021-07-23 NOTE — Telephone Encounter (Signed)
Before the trip she should get the new vaccine that does have Omicron variant protection

## 2021-07-26 ENCOUNTER — Other Ambulatory Visit: Payer: Self-pay

## 2021-07-26 ENCOUNTER — Other Ambulatory Visit (HOSPITAL_BASED_OUTPATIENT_CLINIC_OR_DEPARTMENT_OTHER): Payer: Self-pay

## 2021-07-26 ENCOUNTER — Ambulatory Visit: Payer: Self-pay | Attending: Internal Medicine

## 2021-07-26 DIAGNOSIS — Z23 Encounter for immunization: Secondary | ICD-10-CM

## 2021-07-26 MED ORDER — PFIZER COVID-19 VAC BIVALENT 30 MCG/0.3ML IM SUSP
INTRAMUSCULAR | 0 refills | Status: DC
  Filled 2021-07-26: qty 0.3, 1d supply, fill #0

## 2021-07-26 NOTE — Progress Notes (Signed)
° °  Covid-19 Vaccination Clinic  Name:  Heidi Crawford    MRN: [id number] DOB: 02-Jul-1961  07/26/2021  [name] was observed post Covid-19 immunization for 15 minutes without incident. She was provided with Vaccine Information Sheet and instruction to access the V-Safe system.   [name] was instructed to call 911 with any severe reactions post vaccine: Difficulty breathing  Swelling of face and throat  A fast heartbeat  A bad rash all over body  Dizziness and weakness   Immunizations Administered     Name Date Dose VIS Date Route   Pfizer Covid-19 Vaccine Bivalent Booster 07/26/2021  9:45 AM 0.3 mL 03/06/2021 Intramuscular   Manufacturer: [place]   Lot: [id number]   [place]: [phone]

## 2021-09-19 ENCOUNTER — Encounter: Payer: Self-pay | Admitting: Orthopedic Surgery

## 2021-09-19 ENCOUNTER — Ambulatory Visit (INDEPENDENT_AMBULATORY_CARE_PROVIDER_SITE_OTHER): Payer: BC Managed Care – PPO | Admitting: Orthopedic Surgery

## 2021-09-19 DIAGNOSIS — M9262 Juvenile osteochondrosis of tarsus, left ankle: Secondary | ICD-10-CM | POA: Diagnosis not present

## 2021-09-19 NOTE — Progress Notes (Signed)
? ?Office Visit Note ?  ?Patient: Heidi Crawford           ?Date of Birth: [birth year]           ?MRN: [id number] ?Visit Date: 09/19/2021 ?             ?Requested by: Laurey Morale, MD ?[facility] ?[place],  [place] [address] ?PCP: Laurey Morale, MD ? ?Chief Complaint  ?Patient presents with  ? Left Achilles Tendon - Follow-up  ? ? ? ? 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  Patient has undergone prolonged physical therapy is still doing a home exercise program she is used anti-inflammatories as well as a heel lift without relief.  Patient states she has burning with walking pain going downstairs pain with activities of daily living. ? ?Assessment & Plan: ?Visit Diagnoses:  ?1. Haglund's deformity   ? ? ?Plan: Patient states she would like to proceed with surgical excision of the Haglund's deformity.  Risks and benefits were discussed including rupture of the Achilles infection pain nonhealing of the incision need for additional surgery to reattach the Achilles.  Patient states she understands wishes to proceed at this time. ? ?Follow-Up Instructions: Return in about 4 weeks (around 10/17/2021).  ? ?Ortho Exam ? ?Patient is alert, oriented, no adenopathy, well-dressed, normal affect, normal respiratory effort. ?Examination patient has a good dorsalis pedis and posterior tibial pulse she does have hypertension controlled with medication no history of diabetes remote history of tobacco use.  Examination she has a painful palpable Happinose deformity.  Review of the radiographs shows a Haglund's deformity of the calcaneus as well has a large calcified spur at the insertion of the Achilles. ? ?Imaging: ?No results found. ?No images are attached to the encounter. ? ?Labs: ?Lab Results  ?Component Value Date  ? HGBA1C 5.4 06/11/2021  ? ? ? ?Lab Results  ?Component Value Date  ? ALBUMIN 4.4 06/11/2021  ? ALBUMIN 4.6 12/23/2018  ? ALBUMIN 4.2  11/13/2017  ? ? ?No results found for: MG ?No results found for: VD25OH ? ?No results found for: PREALBUMIN ?CBC EXTENDED Latest Ref Rng & Units 06/11/2021 12/23/2018 11/13/2017  ?WBC 4.0 - 10.5 K/uL 5.5 6.1 4.9  ?RBC 3.87 - 5.11 Mil/uL 4.76 4.86 4.67  ?HGB 12.0 - 15.0 g/dL 14.2 14.6 14.4  ?HCT 36.0 - 46.0 % 42.2 43.4 41.2  ?PLT 150.0 - 400.0 K/uL 258.0 265.0 274.0  ?NEUTROABS 1.4 - 7.7 K/uL 3.6 4.3 3.2  ?LYMPHSABS 0.7 - 4.0 K/uL 1.4 1.1 1.2  ? ? ? ?There is no height or weight on file to calculate BMI. ? ?Orders:  ?No orders of the defined types were placed in this encounter. ? ?No orders of the defined types were placed in this encounter. ? ? ? Procedures: ?No procedures performed ? ?Clinical Data: ?No additional findings. ? ?ROS: ? ?All other systems negative, except as noted in the HPI. ?Review of Systems ? ?Objective: ?Vital Signs: There were no vitals taken for this visit. ? ?Specialty Comments:  ?No specialty comments available. ? ?PMFS History: ?Patient Active Problem List  ? Diagnosis Date Noted  ? GERD (gastroesophageal reflux disease) 05/20/2019  ? Hyperlipidemia 05/04/2012  ? Essential hypertension 03/05/2009  ? COUGH 12/27/2007  ? ALLERGIC RHINITIS 12/03/2007  ? DEPENDENT EDEMA 11/10/2007  ? TMJ PAIN 03/04/2007  ? HEADACHE 03/04/2007  ? ?Past Medical History:  ?Diagnosis Date  ? Allergy   ?  Dependent edema   ? GERD (gastroesophageal reflux disease)   ? Headache(784.0)   ? Hyperlipidemia   ? Hypertension   ? Squamous cell cancer of skin of forearm   ? right arm   ? TMJ pain dysfunction syndrome   ?  ?Family History  ?Problem Relation Age of Onset  ? Alcohol abuse Unknown   ?     fhx  ? Arthritis Unknown   ?     fhx  ? Hyperlipidemia Unknown   ?     fhx  ? Hypertension Unknown   ?     fhx  ? Heart disease Unknown   ?     fhx  ? Stroke Unknown   ?     fhx  ? Hypertension Mother   ? Hypertension Father   ? Colon cancer Neg Hx   ? Rectal cancer Neg Hx   ? Stomach cancer Neg Hx   ?  ?Past Surgical History:   ?Procedure Laterality Date  ? COLONOSCOPY  09/08/2014  ? per Dr. Henrene Pastor, clear, repeat in 10 yrs   ? DENTAL SURGERY    ? removal skin cancer  2014  ? from right arm per Dr. Delman Cheadle   ? ?Social History  ? ?Occupational History  ? Not on file  ?Tobacco Use  ? Smoking status: Former  ?  Types: Cigarettes  ?  Quit date: 07/07/2000  ?  Years since quitting: 21.2  ? Smokeless tobacco: Never  ?Vaping Use  ? Vaping Use: Never used  ?Substance and Sexual Activity  ? Alcohol use: No  ?  Alcohol/week: 0.0 standard drinks  ? Drug use: No  ? Sexual activity: Not on file  ? ? ? ? ? ?

## 2021-09-23 ENCOUNTER — Encounter: Payer: Self-pay | Admitting: Orthopedic Surgery

## 2021-10-07 DIAGNOSIS — Z1389 Encounter for screening for other disorder: Secondary | ICD-10-CM | POA: Diagnosis not present

## 2021-10-07 DIAGNOSIS — Z130 Encounter for screening for diseases of the blood and blood-forming organs and certain disorders involving the immune mechanism: Secondary | ICD-10-CM | POA: Diagnosis not present

## 2021-10-07 DIAGNOSIS — Z01419 Encounter for gynecological examination (general) (routine) without abnormal findings: Secondary | ICD-10-CM | POA: Diagnosis not present

## 2021-10-07 DIAGNOSIS — Z6835 Body mass index (BMI) 35.0-35.9, adult: Secondary | ICD-10-CM | POA: Diagnosis not present

## 2021-10-09 DIAGNOSIS — Z1231 Encounter for screening mammogram for malignant neoplasm of breast: Secondary | ICD-10-CM | POA: Diagnosis not present

## 2021-10-14 ENCOUNTER — Encounter: Payer: Self-pay | Admitting: Orthopedic Surgery

## 2021-10-23 ENCOUNTER — Telehealth: Payer: Self-pay | Admitting: Orthopedic Surgery

## 2021-10-23 NOTE — Telephone Encounter (Signed)
Pt called requesting a call back as soon as possible. Pt is having surgery Friday and has no information of time, what meds to take and what not to take. Pt states. Pre admissions has no contacted her and she need to know. Please call pt right away at [phone]. ?

## 2021-10-23 NOTE — Telephone Encounter (Signed)
I spoke with Heidi Crawford, she stated that she was alarmed because she had not spoke with the pre-admissions department.  Before I placed the call she was able to speak with someone who told her that they were very busy and "a little behind".  She is aware that the pre-op nurse will call tomorrow, day before her procedure.  ?

## 2021-10-24 ENCOUNTER — Encounter (HOSPITAL_COMMUNITY): Payer: Self-pay | Admitting: Orthopedic Surgery

## 2021-10-24 ENCOUNTER — Other Ambulatory Visit: Payer: Self-pay

## 2021-10-24 NOTE — Anesthesia Preprocedure Evaluation (Addendum)
Anesthesia Evaluation  ?Patient identified by MRN, date of birth, ID band ?Patient awake ? ? ? ?Reviewed: ?Allergy & Precautions, NPO status , Patient's Chart, lab work & pertinent test results ? ?History of Anesthesia Complications ?Negative for: history of anesthetic complications ? ?Airway ?Mallampati: III ? ?TM Distance: >3 FB ?Neck ROM: Full ? ? ? Dental ? ?(+) Teeth Intact ?  ?Pulmonary ?neg pulmonary ROS, former smoker,  ?  ?Pulmonary exam normal ? ? ? ? ? ? ? Cardiovascular ?hypertension, Pt. on medications ?Normal cardiovascular exam ? ? ?  ?Neuro/Psych ?negative neurological ROS ?   ? GI/Hepatic ?Neg liver ROS, GERD  ,  ?Endo/Other  ?negative endocrine ROS ? Renal/GU ?negative Renal ROS  ?negative genitourinary ?  ?Musculoskeletal ?negative musculoskeletal ROS ?(+)  ? Abdominal ?  ?Peds ? Hematology ?negative hematology ROS ?(+)   ?Anesthesia Other Findings ? ? Reproductive/Obstetrics ? ?  ? ? ? ? ? ? ? ? ? ? ? ? ? ?  ?  ? ? ? ? ? ? ? ?Anesthesia Physical ?Anesthesia Plan ? ?ASA: 2 ? ?Anesthesia Plan: MAC  ? ?Post-op Pain Management: Regional block*, Tylenol PO (pre-op)* and Toradol IV (intra-op)*  ? ?Induction: Intravenous ? ?PONV Risk Score and Plan: 2 and Propofol infusion, TIVA and Treatment may vary due to age or medical condition ? ?Airway Management Planned: Natural Airway, Nasal Cannula and Simple Face Mask ? ?Additional Equipment: None ? ?Intra-op Plan:  ? ?Post-operative Plan:  ? ?Informed Consent: I have reviewed the patients History and Physical, chart, labs and discussed the procedure including the risks, benefits and alternatives for the proposed anesthesia with the patient or authorized representative who has indicated his/her understanding and acceptance.  ? ? ? ? ? ?Plan Discussed with:  ? ?Anesthesia Plan Comments:   ? ? ? ? ? ?Anesthesia Quick Evaluation ? ?

## 2021-10-24 NOTE — Progress Notes (Signed)
Heidi Crawford denies chest pain or shortness of breath. Patient denies having any s/s of Covid in her household.  Patient denies any known exposure to Covid.  ? ?PCP is Dr. Delma Freeze. ?I instructed  Heidi Crawford to shower with antibacteria soap.  DO not shave. No nail polish, artificial or acrylic nails. Wear clean clothes, brush your teeth. ?Glasses, contact lens,dentures or partials may not be worn in the OR. If you need to wear them, please bring a case for glasses, do not wear contacts or bring a case, the hospital does not have contact cases, dentures or partials will have to be removed , make sure they are clean, we will provide a denture cup to put them in. You will need some one to drive you home and a responsible person over the age of 19 to stay with you for the first 24 hours after surgery.  ?

## 2021-10-25 ENCOUNTER — Telehealth: Payer: Self-pay

## 2021-10-25 ENCOUNTER — Telehealth: Payer: Self-pay | Admitting: Orthopedic Surgery

## 2021-10-25 ENCOUNTER — Other Ambulatory Visit: Payer: Self-pay

## 2021-10-25 ENCOUNTER — Ambulatory Visit (HOSPITAL_COMMUNITY)
Admission: RE | Admit: 2021-10-25 | Discharge: 2021-10-25 | Disposition: A | Discharge status: Home / Self Care | Payer: BC Managed Care – PPO | Attending: Orthopedic Surgery | Admitting: Orthopedic Surgery

## 2021-10-25 ENCOUNTER — Ambulatory Visit (HOSPITAL_COMMUNITY): Payer: BC Managed Care – PPO | Admitting: Anesthesiology

## 2021-10-25 ENCOUNTER — Encounter (HOSPITAL_COMMUNITY)
Admission: RE | Disposition: A | Discharge status: Home / Self Care | Payer: Self-pay | Source: Home / Self Care | Attending: Orthopedic Surgery

## 2021-10-25 DIAGNOSIS — Z87891 Personal history of nicotine dependence: Secondary | ICD-10-CM | POA: Diagnosis not present

## 2021-10-25 DIAGNOSIS — M6788 Other specified disorders of synovium and tendon, other site: Secondary | ICD-10-CM | POA: Insufficient documentation

## 2021-10-25 DIAGNOSIS — I10 Essential (primary) hypertension: Secondary | ICD-10-CM | POA: Diagnosis not present

## 2021-10-25 DIAGNOSIS — M216X2 Other acquired deformities of left foot: Secondary | ICD-10-CM | POA: Diagnosis not present

## 2021-10-25 DIAGNOSIS — K219 Gastro-esophageal reflux disease without esophagitis: Secondary | ICD-10-CM | POA: Insufficient documentation

## 2021-10-25 DIAGNOSIS — M9262 Juvenile osteochondrosis of tarsus, left ankle: Secondary | ICD-10-CM

## 2021-10-25 HISTORY — PX: EXCISION HAGLUND'S DEFORMITY WITH ACHILLES TENDON REPAIR: SHX5627

## 2021-10-25 HISTORY — DX: Pneumonia, unspecified organism: J18.9

## 2021-10-25 HISTORY — DX: Hyperlipidemia, unspecified: E78.5

## 2021-10-25 LAB — BASIC METABOLIC PANEL
Anion gap: 8 (ref 5–15)
BUN: 12 mg/dL (ref 8–23)
CO2: 29 mmol/L (ref 22–32)
Calcium: 9.3 mg/dL (ref 8.9–10.3)
Chloride: 102 mmol/L (ref 98–111)
Creatinine, Ser: 0.94 mg/dL (ref 0.44–1.00)
GFR, Estimated: >60 mL/min (ref >60)
Glucose, Bld: 107 mg/dL — ABNORMAL HIGH (ref 70–99)
Potassium: 3.1 mmol/L — ABNORMAL LOW (ref 3.5–5.1)
Sodium: 139 mmol/L (ref 135–145)

## 2021-10-25 LAB — CBC
HCT: 44.4 % (ref 36.0–46.0)
Hemoglobin: 14.6 g/dL (ref 12.0–15.0)
MCH: 29.7 pg (ref 26.0–34.0)
MCHC: 32.9 g/dL (ref 30.0–36.0)
MCV: 90.4 fL (ref 80.0–100.0)
Platelets: 270 10*3/uL (ref 150–400)
RBC: 4.91 MIL/uL (ref 3.87–5.11)
RDW: 12.1 % (ref 11.5–15.5)
WBC: 5.5 10*3/uL (ref 4.0–10.5)
nRBC: 0 % (ref 0.0–0.2)

## 2021-10-25 SURGERY — EXCISION HAGLUND'S DEFORMITY WITH ACHILLES TENDON REPAIR
Anesthesia: Monitor Anesthesia Care | Laterality: Left

## 2021-10-25 MED ORDER — PROPOFOL 10 MG/ML IV BOLUS
INTRAVENOUS | Status: AC
  Filled 2021-10-25: qty 20

## 2021-10-25 MED ORDER — PROPOFOL 500 MG/50ML IV EMUL
INTRAVENOUS | Status: DC | PRN
  Administered 2021-10-25: 80 ug/kg/min via INTRAVENOUS

## 2021-10-25 MED ORDER — LIDOCAINE 2% (20 MG/ML) 5 ML SYRINGE
INTRAMUSCULAR | Status: DC | PRN
  Administered 2021-10-25: 40 mg via INTRAVENOUS

## 2021-10-25 MED ORDER — ONDANSETRON HCL 4 MG/2ML IJ SOLN
INTRAMUSCULAR | Status: AC
  Filled 2021-10-25: qty 2

## 2021-10-25 MED ORDER — FENTANYL CITRATE (PF) 250 MCG/5ML IJ SOLN
INTRAMUSCULAR | Status: AC
Start: 2021-10-25 — End: ?
  Filled 2021-10-25: qty 5

## 2021-10-25 MED ORDER — PROPOFOL 10 MG/ML IV BOLUS
INTRAVENOUS | Status: DC | PRN
  Administered 2021-10-25 (×2): 25 mg via INTRAVENOUS

## 2021-10-25 MED ORDER — LACTATED RINGERS IV SOLN: INTRAVENOUS | Status: DC

## 2021-10-25 MED ORDER — HYDROCODONE-ACETAMINOPHEN 5-325 MG PO TABS: 1.0000 | ORAL_TABLET | ORAL | 0 refills | Status: DC | PRN

## 2021-10-25 MED ORDER — MIDAZOLAM HCL 2 MG/2ML IJ SOLN
INTRAMUSCULAR | Status: AC
  Filled 2021-10-25: qty 2

## 2021-10-25 MED ORDER — FENTANYL CITRATE (PF) 250 MCG/5ML IJ SOLN
INTRAMUSCULAR | Status: DC | PRN
  Administered 2021-10-25: 25 ug via INTRAVENOUS
  Administered 2021-10-25: 100 ug via INTRAVENOUS
  Administered 2021-10-25: 50 ug via INTRAVENOUS
  Administered 2021-10-25: 75 ug via INTRAVENOUS

## 2021-10-25 MED ORDER — LIDOCAINE 2% (20 MG/ML) 5 ML SYRINGE
INTRAMUSCULAR | Status: AC
  Filled 2021-10-25: qty 5

## 2021-10-25 MED ORDER — ONDANSETRON HCL 4 MG/2ML IJ SOLN
INTRAMUSCULAR | Status: DC | PRN
Start: 2021-10-25 — End: 2021-10-25
  Administered 2021-10-25: 4 mg via INTRAVENOUS

## 2021-10-25 MED ORDER — 0.9 % SODIUM CHLORIDE (POUR BTL) OPTIME
TOPICAL | Status: DC | PRN
  Administered 2021-10-25 (×2): 1000 mL

## 2021-10-25 MED ORDER — CHLORHEXIDINE GLUCONATE 0.12 % MT SOLN
15.0000 mL | Freq: Once | OROMUCOSAL | Status: AC
  Administered 2021-10-25: 15 mL via OROMUCOSAL
  Filled 2021-10-25: qty 15

## 2021-10-25 MED ORDER — LIDOCAINE HCL (PF) 1 % IJ SOLN
INTRAMUSCULAR | Status: DC | PRN
  Administered 2021-10-25: 5 mL

## 2021-10-25 MED ORDER — MIDAZOLAM HCL 2 MG/2ML IJ SOLN
INTRAMUSCULAR | Status: DC | PRN
  Administered 2021-10-25: 1 mg via INTRAVENOUS

## 2021-10-25 MED ORDER — EPHEDRINE SULFATE-NACL 50-0.9 MG/10ML-% IV SOSY
PREFILLED_SYRINGE | INTRAVENOUS | Status: DC | PRN
  Administered 2021-10-25 (×2): 10 mg via INTRAVENOUS
  Administered 2021-10-25: 5 mg via INTRAVENOUS

## 2021-10-25 MED ORDER — EPHEDRINE 5 MG/ML INJ
INTRAVENOUS | Status: AC
  Filled 2021-10-25: qty 5

## 2021-10-25 MED ORDER — VANCOMYCIN HCL IN DEXTROSE 1-5 GM/200ML-% IV SOLN
1000.0000 mg | INTRAVENOUS | Status: AC
Start: 2021-10-25 — End: 2021-10-25
  Administered 2021-10-25: 1000 mg via INTRAVENOUS
  Filled 2021-10-25: qty 200

## 2021-10-25 MED ORDER — ROPIVACAINE HCL 5 MG/ML IJ SOLN
INTRAMUSCULAR | Status: DC | PRN
  Administered 2021-10-25: 15 mL via PERINEURAL
  Administered 2021-10-25: 25 mL via PERINEURAL

## 2021-10-25 MED ORDER — ORAL CARE MOUTH RINSE: 15.0000 mL | Freq: Once | OROMUCOSAL | Status: AC

## 2021-10-25 MED ORDER — DEXAMETHASONE SODIUM PHOSPHATE 10 MG/ML IJ SOLN
INTRAMUSCULAR | Status: DC | PRN
  Administered 2021-10-25: 5 mg

## 2021-10-25 MED ORDER — ACETAMINOPHEN 500 MG PO TABS
1000.0000 mg | ORAL_TABLET | Freq: Once | ORAL | Status: AC
  Administered 2021-10-25: 1000 mg via ORAL
  Filled 2021-10-25: qty 2

## 2021-10-25 MED ORDER — LIDOCAINE HCL (PF) 1 % IJ SOLN
INTRAMUSCULAR | Status: AC
  Filled 2021-10-25: qty 30

## 2021-10-25 SURGICAL SUPPLY — 40 items
ANCH SUT 2 SWLK 19.1 CLS EYLT (Anchor) ×1 IMPLANT
ANCHOR SWIVELOCK BIO 4.75X19.1 (Anchor) ×1 IMPLANT
BAG COUNTER SPONGE SURGICOUNT (BAG) ×2 IMPLANT
BAG SPNG CNTER NS LX DISP (BAG)
BNDG COHESIVE 6X5 TAN NS LF (GAUZE/BANDAGES/DRESSINGS) ×1 IMPLANT
BNDG COHESIVE 6X5 TAN STRL LF (GAUZE/BANDAGES/DRESSINGS) ×3 IMPLANT
BNDG GAUZE ELAST 4 BULKY (GAUZE/BANDAGES/DRESSINGS) ×1 IMPLANT
BUR SURG 4X8 MED (BURR) IMPLANT
BURR SURG 4X8 MED (BURR) ×2
COVER MAYO STAND STRL (DRAPES) ×2 IMPLANT
COVER SURGICAL LIGHT HANDLE (MISCELLANEOUS) ×3 IMPLANT
CUFF TOURN SGL QUICK 34 (TOURNIQUET CUFF)
CUFF TOURN SGL QUICK 42 (TOURNIQUET CUFF) IMPLANT
CUFF TRNQT CYL 34X4.125X (TOURNIQUET CUFF) IMPLANT
DRAPE U-SHAPE 47X51 STRL (DRAPES) ×2 IMPLANT
DRSG ADAPTIC 3X8 NADH LF (GAUZE/BANDAGES/DRESSINGS) ×1 IMPLANT
DRSG EMULSION OIL 3X3 NADH (GAUZE/BANDAGES/DRESSINGS) ×2 IMPLANT
DURAPREP 26ML APPLICATOR (WOUND CARE) ×4 IMPLANT
ELECT REM PT RETURN 9FT ADLT (ELECTROSURGICAL) ×2
ELECTRODE REM PT RTRN 9FT ADLT (ELECTROSURGICAL) ×2 IMPLANT
GAUZE SPONGE 4X4 12PLY STRL (GAUZE/BANDAGES/DRESSINGS) ×3 IMPLANT
GLOVE BIOGEL PI IND STRL 9 (GLOVE) ×2 IMPLANT
GLOVE BIOGEL PI INDICATOR 9 (GLOVE) ×1
GLOVE SURG ORTHO 9.0 STRL STRW (GLOVE) ×3 IMPLANT
GOWN STRL REUS W/ TWL XL LVL3 (GOWN DISPOSABLE) ×4 IMPLANT
GOWN STRL REUS W/TWL XL LVL3 (GOWN DISPOSABLE) ×4
KIT BASIN OR (CUSTOM PROCEDURE TRAY) ×3 IMPLANT
KIT TURNOVER KIT B (KITS) ×3 IMPLANT
PACK ORTHO EXTREMITY (CUSTOM PROCEDURE TRAY) ×3 IMPLANT
PAD ARMBOARD 7.5X6 YLW CONV (MISCELLANEOUS) ×6 IMPLANT
PADDING CAST ABS 4INX4YD NS (CAST SUPPLIES)
PADDING CAST ABS COTTON 4X4 ST (CAST SUPPLIES) ×2 IMPLANT
PADDING CAST SYNTHETIC 4 (CAST SUPPLIES) ×2
PADDING CAST SYNTHETIC 4X4 STR (CAST SUPPLIES) IMPLANT
SPLINT FIBERGLASS 4X30 (CAST SUPPLIES) ×2 IMPLANT
SPONGE T-LAP 18X18 ~~LOC~~+RFID (SPONGE) ×1 IMPLANT
SUT VIC AB 1 CT1 27 (SUTURE) ×2
SUT VIC AB 1 CT1 27XBRD ANBCTR (SUTURE) IMPLANT
UNDERPAD 30X36 HEAVY ABSORB (UNDERPADS AND DIAPERS) ×3 IMPLANT
WATER STERILE IRR 1000ML POUR (IV SOLUTION) ×2 IMPLANT

## 2021-10-25 NOTE — Telephone Encounter (Signed)
I called pt and she states that the dressing is clean and intact, the dressing does not feel wet now nor does it look visibly soiled. Advised that if that should change at all to call the office. Made offer for her to come in today and pt declined and states that the dressing is dry and does not show any signs of compromise. Will call with any other questions encourage to keep elevated, non weight bearing and may apply ice several times a day made appt for follow up 11/04/21 ?

## 2021-10-25 NOTE — Telephone Encounter (Signed)
I spoke with pt's husband. He believes it was from an ice pack after surgery. The wetness was not discolored or had no smell. They did take the outer wrap off and changed it out. No more wetness. She is elevating her leg. I did state that yes drainage is indicated throughout the first couple weeks of post surgery. Shouldn't have that much afterwards to soak through bandage. He was reassured and all questions were answered. ?

## 2021-10-25 NOTE — Anesthesia Postprocedure Evaluation (Signed)
Anesthesia Post Note ? ?Patient: Dean Wonder ? ?Procedure(s) Performed: EXCISION LEFT HAGLUND'S (Left) ? ?  ? ?Patient location during evaluation: PACU ?Anesthesia Type: MAC ?Level of consciousness: awake and alert ?Pain management: pain level controlled ?Vital Signs Assessment: post-procedure vital signs reviewed and stable ?Respiratory status: spontaneous breathing, nonlabored ventilation and respiratory function stable ?Cardiovascular status: blood pressure returned to baseline and stable ?Postop Assessment: no apparent nausea or vomiting ?Anesthetic complications: no ? ? ?No notable events documented. ? ?Last Vitals:  ?Vitals:  ? 10/25/21 0852 10/25/21 0907  ?BP: 131/78 (!) 124/95  ?Pulse: 70 73  ?Resp: 17 15  ?Temp:  36.7 ?C  ?SpO2: 95% 100%  ?  ?Last Pain:  ?Vitals:  ? 10/25/21 0907  ?TempSrc:   ?PainSc: 0-No pain  ? ? ?  ?  ?  ?  ?  ?  ? ?Lidia Collum ? ? ? ? ?

## 2021-10-25 NOTE — Op Note (Signed)
10/25/2021 ? ?8:40 AM ? ?PATIENT:  Heidi Crawford   ? ?PRE-OPERATIVE DIAGNOSIS:  Haglunds Deformity Left Ankle ? ?POST-OPERATIVE DIAGNOSIS:  Same ? ?PROCEDURE:  EXCISION LEFT HAGLUND'S deformity. ?Reconstruction of the Achilles tendon anchored to bone. ? ?SURGEON:  Newt Minion, MD ? ?PHYSICIAN ASSISTANT:None ?ANESTHESIA:   General ? ?PREOPERATIVE INDICATIONS:  [name] is a  61 y.o. female with a diagnosis of Haglunds Deformity Left Ankle who failed conservative measures and elected for surgical management.   ? ?The risks benefits and alternatives were discussed with the patient preoperatively including but not limited to the risks of infection, bleeding, nerve injury, cardiopulmonary complications, the need for revision surgery, among others, and the patient was willing to proceed. ? ?OPERATIVE IMPLANTS: Absorbable bone anchor. ? ?'@ENCIMAGES'$ @ ? ?OPERATIVE FINDINGS: Patient had a large Haglund's deformity as well as calcification of the distal Achilles.  The calcification distally required resection of the distal Achilles and anchoring to bone. ? ?OPERATIVE PROCEDURE: Patient was brought the operating room after undergoing a regional anesthetic.  After adequate levels anesthesia were obtained patient's left lower extremity was prepped using DuraPrep draped into a sterile field a timeout was called.  A longitudinal posterior medial incision was used just medial to the Achilles this was carried down to the bone.  Retractors were placed to protect the Achilles tendon.  Using the bur the Haglund's deformity was resected.  After resection of the Haglund's deformity the Achilles tendon had a large palpable calcified mass.  I was unable to excise the mass from the tendon and the distal centimeter of the tendon was resected.  Using #1 Vicryl and Krak?w suture technique with 4 strands exited the distal Achilles.  This was then passed through the anchor a 4 mm drill hole was made the anchor was secured and the Achilles  reconstructed tendon to bone and secured to the anchor.  This had a good fit.  The wound was irrigated normal saline using a Allgower Donati suture technique the 2-0 nylon was used to repair th incision.  A sterile dressing was applied followed by a posterior and sugar-tong splint to keep the foot in plantarflexion.  Patient was taken the PACU in stable condition. ? ? ?DISCHARGE PLANNING: ? ?Antibiotic duration: Preoperative antibiotics ? ?Weightbearing: Nonweightbearing on the left ? ?Pain medication: Prescription for Vicodin ? ?Dressing care/ Wound VAC: Splint for 2 weeks ? ?Ambulatory devices: Crutches walker or wheelchair ? ?Discharge to: Home. ? ?Follow-up: In the office 1 week post operative. ? ? ? ? ? ? ?  ?

## 2021-10-25 NOTE — Telephone Encounter (Signed)
VM was left on Triage line. States patient had surgery-left ankle and dressing is damp. Would like to know what should  be done. ? ? ?CB [phone] ?

## 2021-10-25 NOTE — Anesthesia Procedure Notes (Signed)
Anesthesia Regional Block: Adductor canal block  ? ?Pre-Anesthetic Checklist: , timeout performed,  Correct Patient, Correct Site, Correct Laterality,  Correct Procedure, Correct Position, site marked,  Risks and benefits discussed,  Surgical consent,  Pre-op evaluation,  At surgeon's request and post-op pain management ? ?Laterality: Left ? ?Prep: chloraprep     ?  ?Needles:  ?Injection technique: Single-shot ? ?Needle Type: Echogenic Stimulator Needle   ? ? ?Needle Length: 10cm  ?Needle Gauge: 20  ? ? ? ?Additional Needles: ? ? ?Procedures:,,,, ultrasound used (permanent image in chart),,    ?Narrative:  ?Start time: 10/25/2021 6:56 AM ?End time: 10/25/2021 6:59 AM ?Injection made incrementally with aspirations every 5 mL. ? ?Performed by: Personally  ?Anesthesiologist: Lidia Collum, MD ? ?Additional Notes: ?Standard monitors applied. Skin prepped. Good needle visualization with ultrasound. Injection made in 5cc increments with no resistance to injection. Patient tolerated the procedure well. ? ? ? ? ?

## 2021-10-25 NOTE — H&P (Signed)
Heidi Crawford is an 61 y.o. female.   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  Patient has undergone prolonged physical therapy is still doing a home exercise program she is used anti-inflammatories as well as a heel lift without relief.  Patient states she has burning with walking pain going downstairs pain with activities of daily living. ? ?Past Medical History:  ?Diagnosis Date  ? Allergy   ? seasonal  ? Dependent edema   ? GERD (gastroesophageal reflux disease)   ? Headache(784.0)   ? Hyperlipemia   ? Hyperlipidemia   ? Hypertension   ? Pneumonia   ? Squamous cell cancer of skin of forearm   ? right arm   ? TMJ pain dysfunction syndrome   ? ? ?Past Surgical History:  ?Procedure Laterality Date  ? COLONOSCOPY  09/08/2014  ? per Dr. Henrene Pastor, clear, repeat in 10 yrs   ? DENTAL SURGERY    ? removal skin cancer  2014  ? from right arm per Dr. Delman Cheadle   ? ? ?Family History  ?Problem Relation Age of Onset  ? Alcohol abuse Unknown   ?     fhx  ? Arthritis Unknown   ?     fhx  ? Hyperlipidemia Unknown   ?     fhx  ? Hypertension Unknown   ?     fhx  ? Heart disease Unknown   ?     fhx  ? Stroke Unknown   ?     fhx  ? Hypertension Mother   ? Hypertension Father   ? Colon cancer Neg Hx   ? Rectal cancer Neg Hx   ? Stomach cancer Neg Hx   ? ?Social History:  reports that she quit smoking about 21 years ago. Her smoking use included cigarettes. She has never used smokeless tobacco. She reports that she does not drink alcohol and does not use drugs. ? ?Allergies:  ?Allergies  ?Allergen Reactions  ? Augmentin [Amoxicillin-Pot Clavulanate] Diarrhea  ? Penicillins Hives  ? ? ?Medications Prior to Admission  ?Medication Sig Dispense Refill  ? chlorpheniramine (CHLOR-TRIMETON) 4 MG tablet Take 4 mg by mouth at bedtime.    ? furosemide (LASIX) 40 MG tablet TAKE 1 TABLET DAILY 90 tablet 3  ? omeprazole (PRILOSEC) 40  MG capsule Take 1 capsule (40 mg total) by mouth daily. (Patient taking differently: Take 40 mg by mouth daily as needed (acid reflux).) 90 capsule 3  ? simvastatin (ZOCOR) 20 MG tablet TAKE 1 TABLET AT BEDTIME 90 tablet 3  ? valsartan (DIOVAN) 80 MG tablet TAKE 1 TABLET DAILY 90 tablet 3  ? COVID-19 mRNA bivalent vaccine, Pfizer, (PFIZER COVID-19 VAC BIVALENT) injection Inject into the muscle. (Patient not taking: Reported on 10/21/2021) 0.3 mL 0  ? ? ?Results for orders placed or performed during the hospital encounter of 10/25/21 (from the past 48 hour(s))  ?CBC per protocol     Status: None  ? Collection Time: 10/25/21  5:57 AM  ?Result Value Ref Range  ? WBC 5.5 4.0 - 10.5 K/uL  ? RBC 4.91 3.87 - 5.11 MIL/uL  ? Hemoglobin 14.6 12.0 - 15.0 g/dL  ? HCT 44.4 36.0 - 46.0 %  ? MCV 90.4 80.0 - 100.0 fL  ? MCH 29.7 26.0 - 34.0 pg  ? MCHC 32.9 30.0 - 36.0 g/dL  ? RDW 12.1 11.5 - 15.5 %  ?  Platelets 270 150 - 400 K/uL  ? nRBC 0.0 0.0 - 0.2 %  ?  Comment: Performed at [facility], [place] [address], [place], [place] [address]  ?Basic metabolic panel per protocol     Status: Abnormal  ? Collection Time: 10/25/21  5:57 AM  ?Result Value Ref Range  ? Sodium 139 135 - 145 mmol/L  ? Potassium 3.1 (L) 3.5 - 5.1 mmol/L  ? Chloride 102 98 - 111 mmol/L  ? CO2 29 22 - 32 mmol/L  ? Glucose, Bld 107 (H) 70 - 99 mg/dL  ?  Comment: Glucose reference range applies only to samples taken after fasting for at least 8 hours.  ? BUN 12 8 - 23 mg/dL  ? Creatinine, Ser 0.94 0.44 - 1.00 mg/dL  ? Calcium 9.3 8.9 - 10.3 mg/dL  ? GFR, Estimated >60 >60 mL/min  ?  Comment: (NOTE) ?Calculated using the CKD-EPI Creatinine Equation (2021) ?  ? Anion gap 8 5 - 15  ?  Comment: Performed at [facility], [place] [address] [place], [place] [address]  ? ?No results found. ? ?Review of Systems  ?All other systems reviewed and are negative. ? ?Blood pressure (!) 153/90, pulse 93, temperature 98 ?F (36.7 ?C), temperature source Oral, resp. rate 18,  height '5\' 2"'$  (1.575 m), weight 86.2 kg, SpO2 99 %. ?Physical Exam  ?Patient is alert, oriented, no adenopathy, well-dressed, normal affect, normal respiratory effort. ?Examination patient has a good dorsalis pedis and posterior tibial pulse she does have hypertension controlled with medication no history of diabetes remote history of tobacco use.  Examination she has a painful palpable Happinose deformity.  Review of the radiographs shows a Haglund's deformity of the calcaneus as well has a large calcified spur at the insertion of the Achilles. ?Assessment/Plan ?1. Haglund's deformity   ?  ?  ?Plan: Patient states she would like to proceed with surgical excision of the Haglund's deformity.  Risks and benefits were discussed including rupture of the Achilles infection pain nonhealing of the incision need for additional surgery to reattach the Achilles.  Patient states she understands wishes to proceed at this time. ? ?Newt Minion, MD ?10/25/2021, 6:37 AM ? ? ? ?

## 2021-10-25 NOTE — Telephone Encounter (Signed)
Patient's husband called. He wants to know if the bandage should be wet looking. She had surgery today. He LM on Triage VM already but wants a message sent to Dr. Sharol Given and a call back from a nurse. His call back number is [phone] ?

## 2021-10-25 NOTE — Anesthesia Procedure Notes (Signed)
Anesthesia Regional Block: Popliteal block  ? ?Pre-Anesthetic Checklist: , timeout performed,  Correct Patient, Correct Site, Correct Laterality,  Correct Procedure, Correct Position, site marked,  Risks and benefits discussed,  Surgical consent,  Pre-op evaluation,  At surgeon's request and post-op pain management ? ?Laterality: Left ? ?Prep: chloraprep     ?  ?Needles:  ?Injection technique: Single-shot ? ?Needle Type: Echogenic Stimulator Needle   ? ? ?Needle Length: 10cm  ?Needle Gauge: 20  ? ? ? ?Additional Needles: ? ? ?Procedures:,,,, ultrasound used (permanent image in chart),,    ?Narrative:  ?Start time: 10/25/2021 6:59 AM ?End time: 10/25/2021 7:01 AM ? ?Performed by: Personally  ?Anesthesiologist: Lidia Collum, MD ? ?Additional Notes: ?Standard monitors applied. Skin prepped. Good needle visualization with ultrasound. Injection made in 5cc increments with no resistance to injection. Patient tolerated the procedure well. ? ? ? ? ?

## 2021-10-25 NOTE — Transfer of Care (Signed)
Immediate Anesthesia Transfer of Care Note ? ?Patient: Heidi Crawford ? ?Procedure(s) Performed: EXCISION LEFT HAGLUND'S (Left) ? ?Patient Location: PACU ? ?Anesthesia Type:MAC and Regional ? ?Level of Consciousness: awake, drowsy, patient cooperative and responds to stimulation ? ?Airway & Oxygen Therapy: Patient Spontanous Breathing and Patient connected to nasal cannula oxygen ? ?Post-op Assessment: Report given to RN and Post -op Vital signs reviewed and stable ? ?Post vital signs: Reviewed and stable ? ?Last Vitals:  ?Vitals Value Taken Time  ?BP 127/76 10/25/21 0837  ?Temp    ?Pulse 77 10/25/21 0839  ?Resp    ?SpO2 100 % 10/25/21 0839  ?Vitals shown include unvalidated device data. ? ?Last Pain:  ?Vitals:  ? 10/25/21 0630  ?TempSrc: Oral  ?PainSc:   ?   ? ?  ? ?Complications: No notable events documented. ?

## 2021-10-28 ENCOUNTER — Encounter (HOSPITAL_COMMUNITY): Payer: Self-pay | Admitting: Orthopedic Surgery

## 2021-11-04 ENCOUNTER — Ambulatory Visit (INDEPENDENT_AMBULATORY_CARE_PROVIDER_SITE_OTHER): Payer: BC Managed Care – PPO | Admitting: Orthopedic Surgery

## 2021-11-04 ENCOUNTER — Encounter: Payer: Self-pay | Admitting: Orthopedic Surgery

## 2021-11-04 DIAGNOSIS — M9260 Juvenile osteochondrosis of tarsus, unspecified ankle: Secondary | ICD-10-CM | POA: Diagnosis not present

## 2021-11-04 NOTE — Progress Notes (Signed)
? ?Office Visit Note ?  ?Patient: Heidi Crawford           ?Date of Birth: October 27, 1960           ?MRN: [id number] ?Visit Date: 11/04/2021 ?             ?Requested by: Laurey Morale, MD ?[address] ?[place],  [place] [address] ?PCP: Laurey Morale, MD ? ?Chief Complaint  ?Patient presents with  ? Left Achilles Tendon - Routine Post Op  ?  10/25/2021 excision left Haglund's deformity   ? ? ? ? ?HPI: ?Patient is 10 days status post Haglund's resection and excision of the calcified Achilles with reinsertion of the Achilles into the calcaneus.  Patient states she has no pain. ? ?Assessment & Plan: ?Visit Diagnoses:  ?1. Haglund's deformity   ? ? ?Plan: We will start dry dressing changes daily follow-up in 1 week to harvest the sutures continue protected weightbearing with a kneeling scooter she is given a fracture boot with 2 of the 9/16 and heel lifts. ? ?Follow-Up Instructions: Return in about 1 week (around 11/11/2021).  ? ?Ortho Exam ? ?Patient is alert, oriented, no adenopathy, well-dressed, normal affect, normal respiratory effort. ?Examination patient has a good pulse the incision is well approximated there are no ischemic changes no dehiscence no cellulitis or drainage. ? ?Imaging: ?No results found. ?No images are attached to the encounter. ? ?Labs: ?Lab Results  ?Component Value Date  ? HGBA1C 5.4 06/11/2021  ? ? ? ?Lab Results  ?Component Value Date  ? ALBUMIN 4.4 06/11/2021  ? ALBUMIN 4.6 12/23/2018  ? ALBUMIN 4.2 11/13/2017  ? ? ?No results found for: MG ?No results found for: VD25OH ? ?No results found for: PREALBUMIN ? ?  Latest Ref Rng & Units 10/25/2021  ?  5:57 AM 06/11/2021  ? 10:51 AM 12/23/2018  ?  9:34 AM  ?CBC EXTENDED  ?WBC 4.0 - 10.5 K/uL 5.5   5.5   6.1    ?RBC 3.87 - 5.11 MIL/uL 4.91   4.76   4.86    ?Hemoglobin 12.0 - 15.0 g/dL 14.6   14.2   14.6    ?HCT 36.0 - 46.0 % 44.4   42.2   43.4    ?Platelets 150 - 400 K/uL 270   258.0   265.0    ?NEUT# 1.4 - 7.7 K/uL  3.6   4.3    ?Lymph# 0.7 - 4.0 K/uL   1.4   1.1    ? ? ? ?There is no height or weight on file to calculate BMI. ? ?Orders:  ?No orders of the defined types were placed in this encounter. ? ?No orders of the defined types were placed in this encounter. ? ? ? Procedures: ?No procedures performed ? ?Clinical Data: ?No additional findings. ? ?ROS: ? ?All other systems negative, except as noted in the HPI. ?Review of Systems ? ?Objective: ?Vital Signs: There were no vitals taken for this visit. ? ?Specialty Comments:  ?No specialty comments available. ? ?PMFS History: ?Patient Active Problem List  ? Diagnosis Date Noted  ? Haglund's deformity of left heel   ? GERD (gastroesophageal reflux disease) 05/20/2019  ? Hyperlipidemia 05/04/2012  ? Essential hypertension 03/05/2009  ? COUGH 12/27/2007  ? ALLERGIC RHINITIS 12/03/2007  ? DEPENDENT EDEMA 11/10/2007  ? TMJ PAIN 03/04/2007  ? HEADACHE 03/04/2007  ? ?Past Medical History:  ?Diagnosis Date  ? Allergy   ? seasonal  ? Dependent edema   ?  GERD (gastroesophageal reflux disease)   ? Headache(784.0)   ? Hyperlipemia   ? Hyperlipidemia   ? Hypertension   ? Pneumonia   ? Squamous cell cancer of skin of forearm   ? right arm   ? TMJ pain dysfunction syndrome   ?  ?Family History  ?Problem Relation Age of Onset  ? Alcohol abuse Unknown   ?     fhx  ? Arthritis Unknown   ?     fhx  ? Hyperlipidemia Unknown   ?     fhx  ? Hypertension Unknown   ?     fhx  ? Heart disease Unknown   ?     fhx  ? Stroke Unknown   ?     fhx  ? Hypertension Mother   ? Hypertension Father   ? Colon cancer Neg Hx   ? Rectal cancer Neg Hx   ? Stomach cancer Neg Hx   ?  ?Past Surgical History:  ?Procedure Laterality Date  ? COLONOSCOPY  09/08/2014  ? per Dr. Henrene Pastor, clear, repeat in 10 yrs   ? DENTAL SURGERY    ? EXCISION HAGLUND'S DEFORMITY WITH ACHILLES TENDON REPAIR Left 10/25/2021  ? Procedure: EXCISION LEFT HAGLUND'S;  Surgeon: Newt Minion, MD;  Location: [facility];  Service: Orthopedics;  Laterality: Left;  ? removal skin cancer  2014  ?  from right arm per Dr. Delman Cheadle   ? ?Social History  ? ?Occupational History  ? Not on file  ?Tobacco Use  ? Smoking status: Former  ?  Types: Cigarettes  ?  Quit date: 07/07/2000  ?  Years since quitting: 21.3  ? Smokeless tobacco: Never  ?Vaping Use  ? Vaping Use: Never used  ?Substance and Sexual Activity  ? Alcohol use: No  ?  Alcohol/week: 0.0 standard drinks  ? Drug use: No  ? Sexual activity: Not on file  ? ? ? ? ? ?

## 2021-11-11 ENCOUNTER — Ambulatory Visit (INDEPENDENT_AMBULATORY_CARE_PROVIDER_SITE_OTHER): Payer: BC Managed Care – PPO | Admitting: Orthopedic Surgery

## 2021-11-11 ENCOUNTER — Encounter: Payer: Self-pay | Admitting: Orthopedic Surgery

## 2021-11-11 DIAGNOSIS — M9262 Juvenile osteochondrosis of tarsus, left ankle: Secondary | ICD-10-CM

## 2021-11-11 DIAGNOSIS — M9260 Juvenile osteochondrosis of tarsus, unspecified ankle: Secondary | ICD-10-CM

## 2021-11-11 NOTE — Progress Notes (Signed)
? ?Office Visit Note ?  ?Patient: Heidi Crawford           ?Date of Birth: [birth year]           ?MRN: [id number] ?Visit Date: 11/11/2021 ?             ?Requested by: Laurey Morale, MD ?[address] ?[place],  [place] [address] ?PCP: Laurey Morale, MD ? ?Chief Complaint  ?Patient presents with  ? Left Foot - Routine Post Op  ?  10/25/21 excision left haglunds deformity  ? ? ? ? ?HPI: ?Patient is a 61 year old woman who presents 2-1/2 weeks status post excision left Haglund's deformity and reconstruction of the Achilles tendon.  Patient is currently in a fracture boot kneeling scooter with 08/15/2014 since heel lifts. ? ?Assessment & Plan: ?Visit Diagnoses:  ?1. Haglund's deformity   ? ? ?Plan: Patient will start working on gentle range of motion of her ankle and her toes.  She will advance weightbearing as tolerated in the boot but continue the kneeling scooter for long distances.  Continue with compression. ? ?Follow-Up Instructions: Return in about 3 weeks (around 12/02/2021).  ? ?Ortho Exam ? ?Patient is alert, oriented, no adenopathy, well-dressed, normal affect, normal respiratory effort. ?Examination the incision is well-healed.  Patient has good passive range of motion of the ankle.  The Achilles is intact.  There is no redness no cellulitis no signs of infection. ? ?Imaging: ?No results found. ?No images are attached to the encounter. ? ?Labs: ?Lab Results  ?Component Value Date  ? HGBA1C 5.4 06/11/2021  ? ? ? ?Lab Results  ?Component Value Date  ? ALBUMIN 4.4 06/11/2021  ? ALBUMIN 4.6 12/23/2018  ? ALBUMIN 4.2 11/13/2017  ? ? ?No results found for: MG ?No results found for: VD25OH ? ?No results found for: PREALBUMIN ? ?  Latest Ref Rng & Units 10/25/2021  ?  5:57 AM 06/11/2021  ? 10:51 AM 12/23/2018  ?  9:34 AM  ?CBC EXTENDED  ?WBC 4.0 - 10.5 K/uL 5.5   5.5   6.1    ?RBC 3.87 - 5.11 MIL/uL 4.91   4.76   4.86    ?Hemoglobin 12.0 - 15.0 g/dL 14.6   14.2   14.6    ?HCT 36.0 - 46.0 % 44.4   42.2   43.4    ?Platelets  150 - 400 K/uL 270   258.0   265.0    ?NEUT# 1.4 - 7.7 K/uL  3.6   4.3    ?Lymph# 0.7 - 4.0 K/uL  1.4   1.1    ? ? ? ?There is no height or weight on file to calculate BMI. ? ?Orders:  ?No orders of the defined types were placed in this encounter. ? ?No orders of the defined types were placed in this encounter. ? ? ? Procedures: ?No procedures performed ? ?Clinical Data: ?No additional findings. ? ?ROS: ? ?All other systems negative, except as noted in the HPI. ?Review of Systems ? ?Objective: ?Vital Signs: There were no vitals taken for this visit. ? ?Specialty Comments:  ?No specialty comments available. ? ?PMFS History: ?Patient Active Problem List  ? Diagnosis Date Noted  ? Haglund's deformity of left heel   ? GERD (gastroesophageal reflux disease) 05/20/2019  ? Hyperlipidemia 05/04/2012  ? Essential hypertension 03/05/2009  ? COUGH 12/27/2007  ? ALLERGIC RHINITIS 12/03/2007  ? DEPENDENT EDEMA 11/10/2007  ? TMJ PAIN 03/04/2007  ? HEADACHE 03/04/2007  ? ?Past Medical History:  ?  Diagnosis Date  ? Allergy   ? seasonal  ? Dependent edema   ? GERD (gastroesophageal reflux disease)   ? Headache(784.0)   ? Hyperlipemia   ? Hyperlipidemia   ? Hypertension   ? Pneumonia   ? Squamous cell cancer of skin of forearm   ? right arm   ? TMJ pain dysfunction syndrome   ?  ?Family History  ?Problem Relation Age of Onset  ? Alcohol abuse Unknown   ?     fhx  ? Arthritis Unknown   ?     fhx  ? Hyperlipidemia Unknown   ?     fhx  ? Hypertension Unknown   ?     fhx  ? Heart disease Unknown   ?     fhx  ? Stroke Unknown   ?     fhx  ? Hypertension Mother   ? Hypertension Father   ? Colon cancer Neg Hx   ? Rectal cancer Neg Hx   ? Stomach cancer Neg Hx   ?  ?Past Surgical History:  ?Procedure Laterality Date  ? COLONOSCOPY  09/08/2014  ? per Dr. Henrene Pastor, clear, repeat in 10 yrs   ? DENTAL SURGERY    ? EXCISION HAGLUND'S DEFORMITY WITH ACHILLES TENDON REPAIR Left 10/25/2021  ? Procedure: EXCISION LEFT HAGLUND'S;  Surgeon: Newt Minion,  MD;  Location: [facility];  Service: Orthopedics;  Laterality: Left;  ? removal skin cancer  2014  ? from right arm per Dr. Delman Cheadle   ? ?Social History  ? ?Occupational History  ? Not on file  ?Tobacco Use  ? Smoking status: Former  ?  Types: Cigarettes  ?  Quit date: 07/07/2000  ?  Years since quitting: 21.3  ? Smokeless tobacco: Never  ?Vaping Use  ? Vaping Use: Never used  ?Substance and Sexual Activity  ? Alcohol use: No  ?  Alcohol/week: 0.0 standard drinks  ? Drug use: No  ? Sexual activity: Not on file  ? ? ? ? ? ?

## 2021-12-05 ENCOUNTER — Ambulatory Visit (INDEPENDENT_AMBULATORY_CARE_PROVIDER_SITE_OTHER): Payer: BC Managed Care – PPO | Admitting: Orthopedic Surgery

## 2021-12-05 DIAGNOSIS — M9260 Juvenile osteochondrosis of tarsus, unspecified ankle: Secondary | ICD-10-CM

## 2021-12-06 ENCOUNTER — Encounter: Payer: Self-pay | Admitting: Orthopedic Surgery

## 2021-12-06 NOTE — Progress Notes (Signed)
Office Visit Note   Patient: Heidi Crawford           Date of Birth: [birth year]           MRN: [id number] Visit Date: 12/05/2021              Requested by: Laurey Morale, MD [place],  [place] [address] PCP: Laurey Morale, MD  Chief Complaint  Patient presents with   Left Foot - Routine Post Op    10/25/21       HPI: Patient is a 61 year old woman who presents 5 weeks status post excision Haglund's deformity reattachment of Achilles tendon.  She is weightbearing as tolerated with a fracture boot and heel lift.  Assessment & Plan: Visit Diagnoses:  1. Haglund's deformity     Plan: Patient will increase her activities as tolerated we will start her with physical therapy at [facility] in [place].  Follow-Up Instructions: Return in about 4 weeks (around 01/02/2022).   Ortho Exam  Patient is alert, oriented, no adenopathy, well-dressed, normal affect, normal respiratory effort and in her mid Achilles tendon.  There is no palpable defect.  She has good range of motion.. Examination the incision is well-healed patient feels like  Imaging: No results found. No images are attached to the encounter.  Labs: Lab Results  Component Value Date   HGBA1C 5.4 06/11/2021     Lab Results  Component Value Date   ALBUMIN 4.4 06/11/2021   ALBUMIN 4.6 12/23/2018   ALBUMIN 4.2 11/13/2017    No results found for: MG No results found for: VD25OH  No results found for: PREALBUMIN    Latest Ref Rng & Units 10/25/2021    5:57 AM 06/11/2021   10:51 AM 12/23/2018    9:34 AM  CBC EXTENDED  WBC 4.0 - 10.5 K/uL 5.5   5.5   6.1    RBC 3.87 - 5.11 MIL/uL 4.91   4.76   4.86    Hemoglobin 12.0 - 15.0 g/dL 14.6   14.2   14.6    HCT 36.0 - 46.0 % 44.4   42.2   43.4    Platelets 150 - 400 K/uL 270   258.0   265.0    NEUT# 1.4 - 7.7 K/uL  3.6   4.3    Lymph# 0.7 - 4.0 K/uL  1.4   1.1       There is no height or weight on file to calculate BMI.  Orders:  Orders Placed  This Encounter  Procedures   Ambulatory referral to Physical Therapy   No orders of the defined types were placed in this encounter.    Procedures: No procedures performed  Clinical Data: No additional findings.  ROS:  All other systems negative, except as noted in the HPI. Review of Systems  Objective: Vital Signs: There were no vitals taken for this visit.  Specialty Comments:  No specialty comments available.  PMFS History: Patient Active Problem List   Diagnosis Date Noted   Haglund's deformity of left heel    GERD (gastroesophageal reflux disease) 05/20/2019   Hyperlipidemia 05/04/2012   Essential hypertension 03/05/2009   COUGH 12/27/2007   ALLERGIC RHINITIS 12/03/2007   DEPENDENT EDEMA 11/10/2007   TMJ PAIN 03/04/2007   HEADACHE 03/04/2007   Past Medical History:  Diagnosis Date   Allergy    seasonal   Dependent edema    GERD (gastroesophageal reflux disease)    Headache(784.0)    Hyperlipemia  Hyperlipidemia    Hypertension    Pneumonia    Squamous cell cancer of skin of forearm    right arm    TMJ pain dysfunction syndrome     Family History  Problem Relation Age of Onset   Alcohol abuse Unknown        fhx   Arthritis Unknown        fhx   Hyperlipidemia Unknown        fhx   Hypertension Unknown        fhx   Heart disease Unknown        fhx   Stroke Unknown        fhx   Hypertension Mother    Hypertension Father    Colon cancer Neg Hx    Rectal cancer Neg Hx    Stomach cancer Neg Hx     Past Surgical History:  Procedure Laterality Date   COLONOSCOPY  09/08/2014   per Dr. Henrene Pastor, clear, repeat in 10 yrs    [place] Left 10/25/2021   Procedure: EXCISION LEFT HAGLUND'S;  Surgeon: Newt Minion, MD;  Location: [facility];  Service: Orthopedics;  Laterality: Left;   removal skin cancer  2014   from right arm per Dr. Delman Cheadle    Social History   Occupational History   Not  on file  Tobacco Use   Smoking status: Former    Types: Cigarettes    Quit date: 07/07/2000    Years since quitting: 21.4   Smokeless tobacco: Never  Vaping Use   Vaping Use: Never used  Substance and Sexual Activity   Alcohol use: No    Alcohol/week: 0.0 standard drinks   Drug use: No   Sexual activity: Not on file

## 2021-12-09 ENCOUNTER — Encounter: Payer: BC Managed Care – PPO | Admitting: Orthopedic Surgery

## 2021-12-09 ENCOUNTER — Encounter: Payer: Self-pay | Admitting: Family Medicine

## 2021-12-09 ENCOUNTER — Other Ambulatory Visit: Payer: Self-pay

## 2021-12-09 MED ORDER — VALSARTAN 80 MG PO TABS: 80.0000 mg | ORAL_TABLET | Freq: Every day | ORAL | 0 refills | Status: DC

## 2021-12-09 MED ORDER — FUROSEMIDE 40 MG PO TABS: 40.0000 mg | ORAL_TABLET | Freq: Every day | ORAL | 0 refills | Status: DC

## 2021-12-12 ENCOUNTER — Encounter: Payer: Self-pay | Admitting: Physical Therapy

## 2021-12-12 ENCOUNTER — Ambulatory Visit: Payer: BC Managed Care – PPO | Attending: Orthopedic Surgery | Admitting: Physical Therapy

## 2021-12-12 ENCOUNTER — Other Ambulatory Visit: Payer: Self-pay

## 2021-12-12 DIAGNOSIS — M25672 Stiffness of left ankle, not elsewhere classified: Secondary | ICD-10-CM | POA: Diagnosis not present

## 2021-12-12 DIAGNOSIS — M9260 Juvenile osteochondrosis of tarsus, unspecified ankle: Secondary | ICD-10-CM | POA: Diagnosis not present

## 2021-12-12 DIAGNOSIS — R600 Localized edema: Secondary | ICD-10-CM | POA: Diagnosis not present

## 2021-12-12 DIAGNOSIS — R2689 Other abnormalities of gait and mobility: Secondary | ICD-10-CM | POA: Insufficient documentation

## 2021-12-12 DIAGNOSIS — M25572 Pain in left ankle and joints of left foot: Secondary | ICD-10-CM | POA: Diagnosis not present

## 2021-12-12 DIAGNOSIS — M6281 Muscle weakness (generalized): Secondary | ICD-10-CM | POA: Diagnosis not present

## 2021-12-12 DIAGNOSIS — R262 Difficulty in walking, not elsewhere classified: Secondary | ICD-10-CM | POA: Diagnosis not present

## 2021-12-12 NOTE — Patient Instructions (Signed)
Access Code: [email or web address] URL: [email or web address] Date: 12/12/2021 Prepared by: Almyra Free  Exercises - Seated Ankle Pumps on Table  - 2 x daily - 7 x weekly - 2 sets - 10 reps - Seated Ankle Circles  - 2 x daily - 7 x weekly - 2 sets - 10 reps - Seated Ankle Inversion Eversion AROM  - 3 x daily - 7 x weekly - 2 sets - 10 reps  Patient Education - Scar Massage

## 2021-12-12 NOTE — Therapy (Signed)
[place], [place], [address] Phone: [phone]   Fax:  [phone]  Physical Therapy Evaluation  Patient Details  Name: Heidi Crawford MRN: [id number] Date of Birth: 1961/06/13 Referring Provider (PT): Newt Minion, MD   Encounter Date: 12/12/2021  Rationale for Evaluation and Treatment Rehabilitation    PT End of Session - 12/12/21 0929     Visit Number 1    Date for PT Re-Evaluation 01/23/22    Authorization Type BCBS    PT Start Time 0930    PT Stop Time 1016    PT Time Calculation (min) 46 min    Activity Tolerance Patient tolerated treatment well    Behavior During Therapy [facility] for tasks assessed/performed             Past Medical History:  Diagnosis Date   Allergy    seasonal   Dependent edema    GERD (gastroesophageal reflux disease)    Headache(784.0)    Hyperlipemia    Hyperlipidemia    Hypertension    Pneumonia    Squamous cell cancer of skin of forearm    right arm    TMJ pain dysfunction syndrome     Past Surgical History:  Procedure Laterality Date   COLONOSCOPY  09/08/2014   per Dr. Henrene Pastor, clear, repeat in 10 yrs    [place] Left 10/25/2021   Procedure: EXCISION LEFT HAGLUND'S;  Surgeon: Newt Minion, MD;  Location: [place];  Service: Orthopedics;  Laterality: Left;   removal skin cancer  2014   from right arm per Dr. Delman Cheadle     There were no vitals filed for this visit.    Subjective Assessment - 12/12/21 0935     Subjective Patient had surgery for Haglund's Deformity and achilles tendon repair on 4/221/23. She has been in a CAM boot since one week post op. Uses scooter. She is WBAT and uses 2 wheel walker at home. Out of boot when now up on it. Pulling at incision but denies pain right now.    Pertinent History HTN    How long can you walk comfortably? limited to walking around the house  with walker    Patient Stated Goals to be able to walk normally, to get rid of AD    Currently in Pain? No/denies                [facility] PT Assessment - 12/12/21 0001       Assessment   Medical Diagnosis M92.60 (ICD-10-CM) - Haglund's deformity    Referring Provider (PT) Newt Minion, MD    Onset Date/Surgical Date 10/25/21    Hand Dominance Right    Next MD Visit 01/01/22    Prior Therapy foot      Precautions   Precautions Fall    Required Braces or Orthoses Other Brace/Splint    Other Brace/Splint CAM BOOT When up on feet; no long distance walking in boot      Restrictions   Weight Bearing Restrictions Yes    LLE Weight Bearing Weight bearing as tolerated      Balance Screen   Has the patient fallen in the past 6 months No    Has the patient had a decrease in activity level because of a fear of falling?  No    Is the patient reluctant to leave their home because of a fear of  falling?  No      Home Environment   Living Environment Private residence    Living Arrangements Spouse/significant other    Type of [place] to enter    Entrance Stairs-Number of Steps 4-5    [place] One level    [place] - 2 wheels;Other (comment)   scooter     Prior Function   Level of Independence Independent    Vocation Full time employment    Vocation Requirements sits at computer; works at home      Observation/Other Assessments   Focus on Therapeutic Outcomes (FOTO)  43 (predicted 63)      Observation/Other Assessments-Edema    Edema Figure 8      Figure 8 Edema   Figure 8 - Right  47 cm   visible edema compared to left   Figure 8 - Left  47 cm      ROM / Strength   AROM / PROM / Strength AROM;PROM;Strength      AROM   AROM Assessment Site Ankle    Right/Left Ankle Right;Left    Right Ankle Dorsiflexion -5    Right Ankle Plantar Flexion 51    Right Ankle Inversion 20    Right Ankle Eversion 15     Left Ankle Dorsiflexion 2    Left Ankle Plantar Flexion 65    Left Ankle Inversion 28    Left Ankle Eversion 20      PROM   PROM Assessment Site Ankle    Right/Left Ankle Right;Left    Right Ankle Inversion 33    Right Ankle Eversion 33    Left Ankle Dorsiflexion 5      Strength   Overall Strength Comments DF not tested; else right ankle 5/5 in available range; Bil hips and knees 5/5 except L hip ABD 4+/5      Palpation   Palpation comment unremarkable      Transfers   Comments I with sit to stand tranfer to RW and scooter      Ambulation/Gait   Ambulation/Gait Yes    Ambulation/Gait Assistance 6: Modified independent (Device/Increase time)    Ambulation Distance (Feet) 20 Feet    Assistive device Rolling walker    Gait Pattern Within Functional Limits    Ambulation Surface Level    Gait Comments Patient unable to ascend stairs using RW; Able to take a few steps with SPC but lacks confidence/balance                        Objective measurements completed on examination: See above findings.                PT Education - 12/12/21 1237     Education Details HEP, scar massage, POC    Person(s) Educated Patient    Methods Explanation;Demonstration;Handout    Comprehension Verbalized understanding;Returned demonstration              PT Short Term Goals - 12/12/21 1238       PT SHORT TERM GOAL #1   Title Ind with initial HEP    Baseline -    Time 2    Period Weeks    Status New    Target Date 12/26/21      PT SHORT TERM GOAL #2   Title Pt able to ascend/descend her steps safely in CAM boot with or without  AD and one UE assist.    Time 2    Period Weeks    Status New    Target Date 12/26/21               PT Long Term Goals - 12/12/21 1239       PT LONG TERM GOAL #1   Title Improve FOTO to 63 showing functional improvement    Baseline 43 at eval    Time 6    Period Weeks    Status New      PT LONG TERM GOAL #2    Title Patient able to ambulate community distances with a normal gait pattern and ascend/descend stairs without difficulty.    Baseline -    Time 6    Period Weeks    Status New      PT LONG TERM GOAL #3   Title Improve B heel cords flexibility to >= 10 degrees to normalize gait.    Baseline -    Time 6    Period Weeks    Status New      PT LONG TERM GOAL #4   Title Demonstrate 5/5 R ankle strength    Baseline -    Time 6    Period Weeks    Status New      PT LONG TERM GOAL #5   Title Heidi Crawford will be independent with her long-term HEP at DC.    Baseline -    Time 6    Period Weeks    Status New                    Plan - 12/12/21 1231     Clinical Impression Statement Pt presents s/p R achilles tendon and Haglund's deformity repair on 10/25/21. She is WBAT in a CAM boot, but MD does not want her walking in the community without scooter at this time. She uses a RW at home. She has limitations in ankle ROM and strength and mild swelling is present. She denies pain at this time. She has 5 stairs to enter her home and sits on her bottom to ascend/descend due to a fear of falling. She will benefit from skilled PT to address these deficits.    Examination-Activity Limitations Locomotion Level;Stairs    Stability/Clinical Decision Making Stable/Uncomplicated    Clinical Decision Making Low    Rehab Potential Excellent    PT Frequency 2x / week    PT Duration 6 weeks    PT Treatment/Interventions ADLs/Self Care Home Management;Aquatic Therapy;Cryotherapy;Electrical Stimulation;Iontophoresis '4mg'$ /ml Dexamethasone;Moist Heat;Balance training;Therapeutic exercise;Therapeutic activities;Functional mobility training;Stair training;Gait training;Neuromuscular re-education;Patient/family education;Dry needling;Manual techniques;Taping;Vasopneumatic Device    PT Next Visit Plan Educate pt on climbing stairs using her RW or one hand rail; Work on [employer] per protocol, progress HEP     PT Home Exercise Plan [phone]             Patient will benefit from skilled therapeutic intervention in order to improve the following deficits and impairments:  Difficulty walking, Decreased range of motion, Decreased balance, Impaired flexibility, Increased edema, Decreased strength  Visit Diagnosis: Stiffness of left ankle, not elsewhere classified  Other abnormalities of gait and mobility  Muscle weakness (generalized)  Localized edema     Problem List Patient Active Problem List   Diagnosis Date Noted   Haglund's deformity of left heel    GERD (gastroesophageal reflux disease) 05/20/2019   Hyperlipidemia 05/04/2012   Essential hypertension 03/05/2009   COUGH 12/27/2007  ALLERGIC RHINITIS 12/03/2007   DEPENDENT EDEMA 11/10/2007   TMJ PAIN 03/04/2007   HEADACHE 03/04/2007    Madelyn Flavors, PT 12/12/2021, 1:12 PM  [facility] [place], [place], [address] Phone: [phone]   Fax:  [phone]  Name: Heidi Crawford MRN: [id number] Date of Birth: 1961/05/12

## 2021-12-16 ENCOUNTER — Ambulatory Visit: Payer: BC Managed Care – PPO | Admitting: Physical Therapy

## 2021-12-16 ENCOUNTER — Encounter: Payer: Self-pay | Admitting: Orthopedic Surgery

## 2021-12-16 DIAGNOSIS — M6281 Muscle weakness (generalized): Secondary | ICD-10-CM | POA: Diagnosis not present

## 2021-12-16 DIAGNOSIS — M9260 Juvenile osteochondrosis of tarsus, unspecified ankle: Secondary | ICD-10-CM | POA: Diagnosis not present

## 2021-12-16 DIAGNOSIS — M25572 Pain in left ankle and joints of left foot: Secondary | ICD-10-CM | POA: Diagnosis not present

## 2021-12-16 DIAGNOSIS — M25672 Stiffness of left ankle, not elsewhere classified: Secondary | ICD-10-CM

## 2021-12-16 DIAGNOSIS — R600 Localized edema: Secondary | ICD-10-CM | POA: Diagnosis not present

## 2021-12-16 DIAGNOSIS — R262 Difficulty in walking, not elsewhere classified: Secondary | ICD-10-CM | POA: Diagnosis not present

## 2021-12-16 DIAGNOSIS — R2689 Other abnormalities of gait and mobility: Secondary | ICD-10-CM

## 2021-12-16 NOTE — Therapy (Signed)
[place], [place], [address] Phone: [phone]   Fax:  [phone]  Physical Therapy Treatment  Patient Details  Name: Heidi Crawford MRN: [id number] Date of Birth: [birth year] Referring Provider (PT): Newt Minion, MD   Encounter Date: 12/16/2021   PT End of Session - 12/16/21 1102     Visit Number 2    Date for PT Re-Evaluation 01/23/22    Authorization Type BCBS    PT Start Time 1015    PT Stop Time 1055    PT Time Calculation (min) 40 min    Activity Tolerance Patient tolerated treatment well    Behavior During Therapy [facility] for tasks assessed/performed             Past Medical History:  Diagnosis Date   Allergy    seasonal   Dependent edema    GERD (gastroesophageal reflux disease)    Headache(784.0)    Hyperlipemia    Hyperlipidemia    Hypertension    Pneumonia    Squamous cell cancer of skin of forearm    right arm    TMJ pain dysfunction syndrome     Past Surgical History:  Procedure Laterality Date   COLONOSCOPY  09/08/2014   per Dr. Henrene Pastor, clear, repeat in 10 yrs    [place] Left 10/25/2021   Procedure: EXCISION LEFT HAGLUND'S;  Surgeon: Newt Minion, MD;  Location: [facility];  Service: Orthopedics;  Laterality: Left;   removal skin cancer  2014   from right arm per Dr. Delman Cheadle     There were no vitals filed for this visit.   Subjective Assessment - 12/16/21 1017     Subjective Pt states she walked into her brother's house with the boot on and went to the restaurant.    Pertinent History HTN    How long can you walk comfortably? limited to walking around the house with walker    Patient Stated Goals to be able to walk normally, to get rid of AD    Currently in Pain? No/denies                [facility] PT Assessment - 12/16/21 0001       Assessment   Medical Diagnosis M92.60 (ICD-10-CM) -  Haglund's deformity    Referring Provider (PT) Newt Minion, MD    Onset Date/Surgical Date 10/25/21    Hand Dominance Right    Next MD Visit 01/01/22                           [facility] Adult PT Treatment/Exercise - 12/16/21 0001       Ambulation/Gait   Stairs Yes    Stairs Assistance 5: Supervision    Stair Management Technique One rail Left;Step to pattern    Number of Stairs 10    Height of Stairs --   4" for 6 steps, 6" for 4 steps   Gait Comments cueing for sequencing step to pattern      Knee/Hip Exercises: Seated   Long Arc Quad Strengthening;Left;2 sets;10 reps    Long Arc Quad Limitations red tband    Other Seated Knee/Hip Exercises hamstring curl 2x10 red tband      Knee/Hip Exercises: Sidelying   Hip ABduction Strengthening;Left;2 sets;10 reps      Knee/Hip Exercises: Prone   Hip Extension Strengthening;Left;2 sets;10 reps  Ankle Exercises: Seated   Ankle Circles/Pumps 20 reps;AROM    Towel Crunch Limitations x20 toe flexion    Other Seated Ankle Exercises seated ankle inv/ev x20    Other Seated Ankle Exercises toe yoga x20                       PT Short Term Goals - 12/12/21 1238       PT SHORT TERM GOAL #1   Title Ind with initial HEP    Baseline -    Time 2    Period Weeks    Status New    Target Date 12/26/21      PT SHORT TERM GOAL #2   Title Pt able to ascend/descend her steps safely in CAM boot with or without AD and one UE assist.    Time 2    Period Weeks    Status New    Target Date 12/26/21               PT Long Term Goals - 12/12/21 1239       PT LONG TERM GOAL #1   Title Improve FOTO to 63 showing functional improvement    Baseline 43 at eval    Time 6    Period Weeks    Status New      PT LONG TERM GOAL #2   Title Patient able to ambulate community distances with a normal gait pattern and ascend/descend stairs without difficulty.    Baseline -    Time 6    Period Weeks    Status New       PT LONG TERM GOAL #3   Title Improve B heel cords flexibility to >= 10 degrees to normalize gait.    Baseline -    Time 6    Period Weeks    Status New      PT LONG TERM GOAL #4   Title Demonstrate 5/5 R ankle strength    Baseline -    Time 6    Period Weeks    Status New      PT LONG TERM GOAL #5   Title Heidi Crawford will be independent with her long-term HEP at DC.    Baseline -    Time 6    Period Weeks    Status New                   Plan - 12/16/21 1036     Clinical Impression Statement Treatment session focused on initiating toe/foot strengthening and gross L LE strengthening for knee and hip. Worked on stairs this session with good pt tolerance and stability.    Examination-Activity Limitations Locomotion Level;Stairs    Stability/Clinical Decision Making Stable/Uncomplicated    Rehab Potential Excellent    PT Frequency 2x / week    PT Duration 6 weeks    PT Treatment/Interventions ADLs/Self Care Home Management;Aquatic Therapy;Cryotherapy;Electrical Stimulation;Iontophoresis '4mg'$ /ml Dexamethasone;Moist Heat;Balance training;Therapeutic exercise;Therapeutic activities;Functional mobility training;Stair training;Gait training;Neuromuscular re-education;Patient/family education;Dry needling;Manual techniques;Taping;Vasopneumatic Device    PT Next Visit Plan Educate pt on climbing stairs using her RW or one hand rail; Work on [employer] per protocol, progress HEP    PT Home Exercise Plan [phone]             Patient will benefit from skilled therapeutic intervention in order to improve the following deficits and impairments:  Difficulty walking, Decreased range of motion, Decreased balance, Impaired flexibility, Increased edema, Decreased strength  Visit  Diagnosis: Stiffness of left ankle, not elsewhere classified  Other abnormalities of gait and mobility  Muscle weakness (generalized)  Localized edema  Difficulty walking  Pain in left ankle and  joints of left foot     Problem List Patient Active Problem List   Diagnosis Date Noted   Haglund's deformity of left heel    GERD (gastroesophageal reflux disease) 05/20/2019   Hyperlipidemia 05/04/2012   Essential hypertension 03/05/2009   COUGH 12/27/2007   ALLERGIC RHINITIS 12/03/2007   DEPENDENT EDEMA 11/10/2007   TMJ PAIN 03/04/2007   HEADACHE 03/04/2007    Tally Mckinnon [address] [place], PT, DPT 12/16/2021, 11:03 AM  [facility] [place], [place], [address] Phone: [phone]   Fax:  [phone]  Name: Heidi Crawford MRN: [id number] Date of Birth: [birth year]

## 2021-12-18 ENCOUNTER — Ambulatory Visit: Payer: BC Managed Care – PPO | Admitting: Physical Therapy

## 2021-12-18 DIAGNOSIS — M25572 Pain in left ankle and joints of left foot: Secondary | ICD-10-CM | POA: Diagnosis not present

## 2021-12-18 DIAGNOSIS — R600 Localized edema: Secondary | ICD-10-CM | POA: Diagnosis not present

## 2021-12-18 DIAGNOSIS — M6281 Muscle weakness (generalized): Secondary | ICD-10-CM

## 2021-12-18 DIAGNOSIS — M9260 Juvenile osteochondrosis of tarsus, unspecified ankle: Secondary | ICD-10-CM | POA: Diagnosis not present

## 2021-12-18 DIAGNOSIS — R262 Difficulty in walking, not elsewhere classified: Secondary | ICD-10-CM | POA: Diagnosis not present

## 2021-12-18 DIAGNOSIS — M25672 Stiffness of left ankle, not elsewhere classified: Secondary | ICD-10-CM

## 2021-12-18 DIAGNOSIS — R2689 Other abnormalities of gait and mobility: Secondary | ICD-10-CM

## 2021-12-18 NOTE — Therapy (Signed)
[place], [place], [address] Phone: [phone]   Fax:  [phone]  Physical Therapy Treatment  Patient Details  Name: Heidi Crawford MRN: [id number] Date of Birth: 1961-07-07 Referring Provider (PT): Newt Minion, MD   Encounter Date: 12/18/2021   PT End of Session - 12/18/21 0933     Visit Number 3    Date for PT Re-Evaluation 01/23/22    Authorization Type BCBS    PT Start Time 0845    PT Stop Time 0930    PT Time Calculation (min) 45 min    Activity Tolerance Patient tolerated treatment well    Behavior During Therapy [facility] for tasks assessed/performed             Past Medical History:  Diagnosis Date   Allergy    seasonal   Dependent edema    GERD (gastroesophageal reflux disease)    Headache(784.0)    Hyperlipemia    Hyperlipidemia    Hypertension    Pneumonia    Squamous cell cancer of skin of forearm    right arm    TMJ pain dysfunction syndrome     Past Surgical History:  Procedure Laterality Date   COLONOSCOPY  09/08/2014   per Dr. Henrene Pastor, clear, repeat in 10 yrs    [place] Left 10/25/2021   Procedure: EXCISION LEFT HAGLUND'S;  Surgeon: Newt Minion, MD;  Location: [facility];  Service: Orthopedics;  Laterality: Left;   removal skin cancer  2014   from right arm per Dr. Delman Cheadle     There were no vitals filed for this visit.   Subjective Assessment - 12/18/21 0851     Subjective Pt reports that Monday night her foot woke her up throbbing. It feels better now.    Pertinent History HTN    How long can you walk comfortably? limited to walking around the house with walker    Patient Stated Goals to be able to walk normally, to get rid of AD    Currently in Pain? No/denies                [facility] PT Assessment - 12/18/21 0001       Assessment   Medical Diagnosis M92.60 (ICD-10-CM) - Haglund's  deformity    Referring Provider (PT) Newt Minion, MD    Onset Date/Surgical Date 10/25/21    Hand Dominance Right                           [facility] Adult PT Treatment/Exercise - 12/18/21 0001       Knee/Hip Exercises: Seated   Long Arc Quad Strengthening;Left;2 sets;10 reps    Long Arc Quad Limitations red tband    Marching Strengthening;Right;Left;2 sets;10 reps    Marching Limitations red tband    Hamstring Curl Strengthening;Left;2 sets;10 reps    Hamstring Limitations red tband      Knee/Hip Exercises: Sidelying   Hip ABduction Strengthening;Left;2 sets;10 reps;Right    Hip ADduction Strengthening;Left;2 sets;10 reps      Knee/Hip Exercises: Prone   Hip Extension Strengthening;Left;2 sets;10 reps;Right      Additional Ankle Exercises DO NOT USE   Towel Crunch Limitations x20 toe flexion      Ankle Exercises: Seated   [place] Pickup x20    Other Seated Ankle Exercises seated ankle inv/ev x20  Other Seated Ankle Exercises toe splaying x20; toe yoga x20      Ankle Exercises: Standing   Other Standing Ankle Exercises tandem stance 2x30 sec    Other Standing Ankle Exercises weightshift L<>R x20 with CAM boot                       PT Short Term Goals - 12/12/21 1238       PT SHORT TERM GOAL #1   Title Ind with initial HEP    Baseline -    Time 2    Period Weeks    Status New    Target Date 12/26/21      PT SHORT TERM GOAL #2   Title Pt able to ascend/descend her steps safely in CAM boot with or without AD and one UE assist.    Time 2    Period Weeks    Status New    Target Date 12/26/21               PT Long Term Goals - 12/12/21 1239       PT LONG TERM GOAL #1   Title Improve FOTO to 63 showing functional improvement    Baseline 43 at eval    Time 6    Period Weeks    Status New      PT LONG TERM GOAL #2   Title Patient able to ambulate community distances with a normal gait pattern and ascend/descend stairs  without difficulty.    Baseline -    Time 6    Period Weeks    Status New      PT LONG TERM GOAL #3   Title Improve B heel cords flexibility to >= 10 degrees to normalize gait.    Baseline -    Time 6    Period Weeks    Status New      PT LONG TERM GOAL #4   Title Demonstrate 5/5 R ankle strength    Baseline -    Time 6    Period Weeks    Status New      PT LONG TERM GOAL #5   Title Kyisha will be independent with her long-term HEP at DC.    Baseline -    Time 6    Period Weeks    Status New                   Plan - 12/18/21 0931     Clinical Impression Statement Reviewed HEP. Continued to work on gross L LE strengthening. Worked on weight shifting and L LE weightbearing as pre-gait tasks.    Examination-Activity Limitations Locomotion Level;Stairs    Stability/Clinical Decision Making Stable/Uncomplicated    Rehab Potential Excellent    PT Frequency 2x / week    PT Duration 6 weeks    PT Treatment/Interventions ADLs/Self Care Home Management;Aquatic Therapy;Cryotherapy;Electrical Stimulation;Iontophoresis '4mg'$ /ml Dexamethasone;Moist Heat;Balance training;Therapeutic exercise;Therapeutic activities;Functional mobility training;Stair training;Gait training;Neuromuscular re-education;Patient/family education;Dry needling;Manual techniques;Taping;Vasopneumatic Device    PT Next Visit Plan Educate pt on climbing stairs using her RW or one hand rail; Work on [employer] per protocol, progress HEP    PT Home Exercise Plan [phone]             Patient will benefit from skilled therapeutic intervention in order to improve the following deficits and impairments:  Difficulty walking, Decreased range of motion, Decreased balance, Impaired flexibility, Increased edema, Decreased strength  Visit Diagnosis: Stiffness of left  ankle, not elsewhere classified  Other abnormalities of gait and mobility  Muscle weakness (generalized)  Difficulty walking  Localized  edema  Pain in left ankle and joints of left foot     Problem List Patient Active Problem List   Diagnosis Date Noted   Haglund's deformity of left heel    GERD (gastroesophageal reflux disease) 05/20/2019   Hyperlipidemia 05/04/2012   Essential hypertension 03/05/2009   COUGH 12/27/2007   ALLERGIC RHINITIS 12/03/2007   DEPENDENT EDEMA 11/10/2007   TMJ PAIN 03/04/2007   HEADACHE 03/04/2007    Tyreisha Ungar April Ma L Tiann Saha, PT, DPT 12/18/2021, 9:38 AM  [facility] [place], [place], [address] Phone: [phone]   Fax:  [phone]  Name: Annajulia Lewing MRN: [id number] Date of Birth: 02-25-1961

## 2021-12-24 ENCOUNTER — Ambulatory Visit: Payer: BC Managed Care – PPO | Admitting: Physical Therapy

## 2021-12-24 DIAGNOSIS — M9260 Juvenile osteochondrosis of tarsus, unspecified ankle: Secondary | ICD-10-CM | POA: Diagnosis not present

## 2021-12-24 DIAGNOSIS — M25672 Stiffness of left ankle, not elsewhere classified: Secondary | ICD-10-CM | POA: Diagnosis not present

## 2021-12-24 DIAGNOSIS — M6281 Muscle weakness (generalized): Secondary | ICD-10-CM | POA: Diagnosis not present

## 2021-12-24 DIAGNOSIS — M25572 Pain in left ankle and joints of left foot: Secondary | ICD-10-CM | POA: Diagnosis not present

## 2021-12-24 DIAGNOSIS — R600 Localized edema: Secondary | ICD-10-CM | POA: Diagnosis not present

## 2021-12-24 DIAGNOSIS — R262 Difficulty in walking, not elsewhere classified: Secondary | ICD-10-CM | POA: Diagnosis not present

## 2021-12-24 DIAGNOSIS — R2689 Other abnormalities of gait and mobility: Secondary | ICD-10-CM | POA: Diagnosis not present

## 2021-12-24 NOTE — Therapy (Signed)
[place], [place], [address] Phone: [phone]   Fax:  [phone]  Physical Therapy Treatment  Patient Details  Name: Marquitta Persichetti MRN: [id number] Date of Birth: [birth year] Referring Provider (PT): Newt Minion, MD   Encounter Date: 12/24/2021   PT End of Session - 12/24/21 0853     Visit Number 4    Date for PT Re-Evaluation 01/23/22    Authorization Type BCBS    PT Start Time 0847    PT Stop Time 0925    PT Time Calculation (min) 38 min    Activity Tolerance Patient tolerated treatment well    Behavior During Therapy [facility] for tasks assessed/performed             Past Medical History:  Diagnosis Date   Allergy    seasonal   Dependent edema    GERD (gastroesophageal reflux disease)    Headache(784.0)    Hyperlipemia    Hyperlipidemia    Hypertension    Pneumonia    Squamous cell cancer of skin of forearm    right arm    TMJ pain dysfunction syndrome     Past Surgical History:  Procedure Laterality Date   COLONOSCOPY  09/08/2014   per Dr. Henrene Pastor, clear, repeat in 10 yrs    [place] Left 10/25/2021   Procedure: EXCISION LEFT HAGLUND'S;  Surgeon: Newt Minion, MD;  Location: [place];  Service: Orthopedics;  Laterality: Left;   removal skin cancer  2014   from right arm per Dr. Delman Cheadle     There were no vitals filed for this visit.   Subjective Assessment - 12/24/21 0853     Subjective Pt states she was able to drive. Has been trying to use knee scooter less. Has been able to get up/down stairs. Pt states ankle hurt a little bit last night due to increased activity. Exercises continue to go well. Pt to see MD next week on Tuesday.    Pertinent History HTN    How long can you walk comfortably? limited to walking around the house with walker    Patient Stated Goals to be able to walk normally, to get rid of AD     Currently in Pain? No/denies                [facility] PT Assessment - 12/24/21 0001       Assessment   Medical Diagnosis M92.60 (ICD-10-CM) - Haglund's deformity    Referring Provider (PT) Newt Minion, MD    Onset Date/Surgical Date 10/25/21    Hand Dominance Right                           [facility] Adult PT Treatment/Exercise - 12/24/21 0001       Knee/Hip Exercises: Seated   Long Arc Quad Strengthening;Left;2 sets;10 reps    Long Arc Quad Limitations green tband    Hamstring Curl Strengthening;Left;2 sets;10 reps    Hamstring Limitations green tband      Ankle Exercises: Seated   Marble Pickup x20    Other Seated Ankle Exercises inv/ev/df/pf isometrics against ball 2x10x3 sec    Other Seated Ankle Exercises toe splaying x20; toe yoga x20      Ankle Exercises: Standing   Other Standing Ankle Exercises hip abduction red tband 2x10 in boot    Other Standing  Ankle Exercises hip extension red tband 2x10 red tband                       PT Short Term Goals - 12/12/21 1238       PT SHORT TERM GOAL #1   Title Ind with initial HEP    Baseline -    Time 2    Period Weeks    Status New    Target Date 12/26/21      PT SHORT TERM GOAL #2   Title Pt able to ascend/descend her steps safely in CAM boot with or without AD and one UE assist.    Time 2    Period Weeks    Status New    Target Date 12/26/21               PT Long Term Goals - 12/12/21 1239       PT LONG TERM GOAL #1   Title Improve FOTO to 63 showing functional improvement    Baseline 43 at eval    Time 6    Period Weeks    Status New      PT LONG TERM GOAL #2   Title Patient able to ambulate community distances with a normal gait pattern and ascend/descend stairs without difficulty.    Baseline -    Time 6    Period Weeks    Status New      PT LONG TERM GOAL #3   Title Improve B heel cords flexibility to >= 10 degrees to normalize gait.    Baseline -    Time 6     Period Weeks    Status New      PT LONG TERM GOAL #4   Title Demonstrate 5/5 R ankle strength    Baseline -    Time 6    Period Weeks    Status New      PT LONG TERM GOAL #5   Title Ambriella will be independent with her long-term HEP at DC.    Baseline -    Time 6    Period Weeks    Status New                   Plan - 12/24/21 0920     Clinical Impression Statement Treatment focused on continuing L foot/ankle strengthening. Working on progressing pt to gentle ankle iso and standing exercises.    Examination-Activity Limitations Locomotion Level;Stairs    Stability/Clinical Decision Making Stable/Uncomplicated    Rehab Potential Excellent    PT Frequency 2x / week    PT Duration 6 weeks    PT Treatment/Interventions ADLs/Self Care Home Management;Aquatic Therapy;Cryotherapy;Electrical Stimulation;Iontophoresis '4mg'$ /ml Dexamethasone;Moist Heat;Balance training;Therapeutic exercise;Therapeutic activities;Functional mobility training;Stair training;Gait training;Neuromuscular re-education;Patient/family education;Dry needling;Manual techniques;Taping;Vasopneumatic Device    PT Next Visit Plan Educate pt on climbing stairs using her RW or one hand rail; Work on [employer] per protocol, progress HEP    PT Home Exercise Plan [phone]             Patient will benefit from skilled therapeutic intervention in order to improve the following deficits and impairments:  Difficulty walking, Decreased range of motion, Decreased balance, Impaired flexibility, Increased edema, Decreased strength  Visit Diagnosis: Stiffness of left ankle, not elsewhere classified  Other abnormalities of gait and mobility  Muscle weakness (generalized)  Difficulty walking  Localized edema  Pain in left ankle and joints of left foot  Problem List Patient Active Problem List   Diagnosis Date Noted   Haglund's deformity of left heel    GERD (gastroesophageal reflux disease) 05/20/2019    Hyperlipidemia 05/04/2012   Essential hypertension 03/05/2009   COUGH 12/27/2007   ALLERGIC RHINITIS 12/03/2007   DEPENDENT EDEMA 11/10/2007   TMJ PAIN 03/04/2007   HEADACHE 03/04/2007    Megen Madewell [address] [place], PT, DPT 12/24/2021, 9:30 AM  [facility] [place], [place], [address] Phone: [phone]   Fax:  [phone]  Name: Jersee Winiarski MRN: [id number] Date of Birth: Jan 18, 1961

## 2021-12-27 ENCOUNTER — Ambulatory Visit: Payer: BC Managed Care – PPO | Admitting: Physical Therapy

## 2021-12-27 DIAGNOSIS — M25672 Stiffness of left ankle, not elsewhere classified: Secondary | ICD-10-CM | POA: Diagnosis not present

## 2021-12-27 DIAGNOSIS — R600 Localized edema: Secondary | ICD-10-CM

## 2021-12-27 DIAGNOSIS — M6281 Muscle weakness (generalized): Secondary | ICD-10-CM

## 2021-12-27 DIAGNOSIS — R2689 Other abnormalities of gait and mobility: Secondary | ICD-10-CM

## 2021-12-27 DIAGNOSIS — M9260 Juvenile osteochondrosis of tarsus, unspecified ankle: Secondary | ICD-10-CM | POA: Diagnosis not present

## 2021-12-27 DIAGNOSIS — M25572 Pain in left ankle and joints of left foot: Secondary | ICD-10-CM

## 2021-12-27 DIAGNOSIS — R262 Difficulty in walking, not elsewhere classified: Secondary | ICD-10-CM | POA: Diagnosis not present

## 2021-12-30 ENCOUNTER — Ambulatory Visit: Payer: BC Managed Care – PPO | Admitting: Physical Therapy

## 2021-12-30 DIAGNOSIS — M25572 Pain in left ankle and joints of left foot: Secondary | ICD-10-CM

## 2021-12-30 DIAGNOSIS — M9260 Juvenile osteochondrosis of tarsus, unspecified ankle: Secondary | ICD-10-CM | POA: Diagnosis not present

## 2021-12-30 DIAGNOSIS — R262 Difficulty in walking, not elsewhere classified: Secondary | ICD-10-CM

## 2021-12-30 DIAGNOSIS — M25672 Stiffness of left ankle, not elsewhere classified: Secondary | ICD-10-CM | POA: Diagnosis not present

## 2021-12-30 DIAGNOSIS — R2689 Other abnormalities of gait and mobility: Secondary | ICD-10-CM | POA: Diagnosis not present

## 2021-12-30 DIAGNOSIS — R600 Localized edema: Secondary | ICD-10-CM

## 2021-12-30 DIAGNOSIS — M6281 Muscle weakness (generalized): Secondary | ICD-10-CM

## 2021-12-30 NOTE — Therapy (Signed)
[facility] [place] [address] [place], [place], [address] Phone: [phone]   Fax:  [phone]  Physical Therapy Treatment  Patient Details  Name: Heidi Crawford MRN: [id number] Date of Birth: 1961-03-18 Referring Provider (PT): Nadara Mustard, MD   Encounter Date: 12/30/2021   PT End of Session - 12/30/21 1146     Visit Number 6    Date for PT Re-Evaluation 01/23/22    Authorization Type BCBS    PT Start Time 1146    PT Stop Time 1230    PT Time Calculation (min) 44 min    Activity Tolerance Patient tolerated treatment well    Behavior During Therapy [facility] for tasks assessed/performed             Past Medical History:  Diagnosis Date   Allergy    seasonal   Dependent edema    GERD (gastroesophageal reflux disease)    Headache(784.0)    Hyperlipemia    Hyperlipidemia    Hypertension    Pneumonia    Squamous cell cancer of skin of forearm    right arm    TMJ pain dysfunction syndrome     Past Surgical History:  Procedure Laterality Date   COLONOSCOPY  09/08/2014   per Dr. Marina Goodell, clear, repeat in 10 yrs    DENTAL SURGERY     EXCISION HAGLUND'S DEFORMITY WITH ACHILLES TENDON REPAIR Left 10/25/2021   Procedure: EXCISION LEFT HAGLUND'S;  Surgeon: Nadara Mustard, MD;  Location: [facility];  Service: Orthopedics;  Laterality: Left;   removal skin cancer  2014   from right arm per Dr. Emily Filbert     There were no vitals filed for this visit.   Subjective Assessment - 12/30/21 1148     Subjective Pt reports she has been doing exercise. Will be seeing Dr. Lajoyce Corners tomorrow for f/u.    Pertinent History HTN    How long can you walk comfortably? limited to walking around the house with walker    Patient Stated Goals to be able to walk normally, to get rid of AD    Currently in Pain? No/denies                [facility] PT Assessment - 12/30/21 0001       Assessment   Medical Diagnosis M92.60 (ICD-10-CM) - Haglund's  deformity    Referring Provider (PT) Nadara Mustard, MD    Onset Date/Surgical Date 10/25/21    Hand Dominance Right                           [facility] Adult PT Treatment/Exercise - 12/30/21 0001       Ambulation/Gait   Ambulation/Gait Assistance 4: Min guard    Ambulation Distance (Feet) 40 Feet    Assistive device Small based quad cane    Gait Pattern Step-to pattern;Step-through pattern   initially step to but able to progress to step through   Ambulation Surface Level;Indoor    Pre-Gait Activities heel/toe and weight shift on L LE 2x10; L LE weight shift sliding right foot forward/back, side to side 2x10 each; L LE weight shift tap R foot forward/back 2x10      Ankle Exercises: Seated   Marble Pickup x20    Other Seated Ankle Exercises yellow tband inv/ev/df/pf 2x10      Ankle Exercises: Standing   Other Standing Ankle Exercises weightshift forward/back L&R x20 with CAM boot  PT Short Term Goals - 12/12/21 1238       PT SHORT TERM GOAL #1   Title Ind with initial HEP    Baseline -    Time 2    Period Weeks    Status New    Target Date 12/26/21      PT SHORT TERM GOAL #2   Title Pt able to ascend/descend her steps safely in CAM boot with or without AD and one UE assist.    Time 2    Period Weeks    Status New    Target Date 12/26/21               PT Long Term Goals - 12/12/21 1239       PT LONG TERM GOAL #1   Title Improve FOTO to 63 showing functional improvement    Baseline 43 at eval    Time 6    Period Weeks    Status New      PT LONG TERM GOAL #2   Title Patient able to ambulate community distances with a normal gait pattern and ascend/descend stairs without difficulty.    Baseline -    Time 6    Period Weeks    Status New      PT LONG TERM GOAL #3   Title Improve B heel cords flexibility to >= 10 degrees to normalize gait.    Baseline -    Time 6    Period Weeks    Status New      PT  LONG TERM GOAL #4   Title Demonstrate 5/5 R ankle strength    Baseline -    Time 6    Period Weeks    Status New      PT LONG TERM GOAL #5   Title Heidi Crawford will be independent with her long-term HEP at DC.    Baseline -    Time 6    Period Weeks    Status New                   Plan - 12/30/21 1237     Clinical Impression Statement Progressed pt's ankle strengthening to light resistance bands per Phase 2 of her protocol. Continued to work on progressing her gait -- worked on transitioning her to using cane for amb. Pt is demonstrating improving L LE weightshift with standing.    Examination-Activity Limitations Locomotion Level;Stairs    Stability/Clinical Decision Making Stable/Uncomplicated    Rehab Potential Excellent    PT Frequency 2x / week    PT Duration 6 weeks    PT Treatment/Interventions ADLs/Self Care Home Management;Aquatic Therapy;Cryotherapy;Electrical Stimulation;Iontophoresis 4mg /ml Dexamethasone;Moist Heat;Balance training;Therapeutic exercise;Therapeutic activities;Functional mobility training;Stair training;Gait training;Neuromuscular re-education;Patient/family education;Dry needling;Manual techniques;Taping;Vasopneumatic Device    PT Next Visit Plan Work on ROM/strength per protocol, progress HEP    PT Home Exercise Plan [phone]    Consulted and Agree with Plan of Care Patient             Patient will benefit from skilled therapeutic intervention in order to improve the following deficits and impairments:  Difficulty walking, Decreased range of motion, Decreased balance, Impaired flexibility, Increased edema, Decreased strength  Visit Diagnosis: Stiffness of left ankle, not elsewhere classified  Other abnormalities of gait and mobility  Muscle weakness (generalized)  Difficulty walking  Localized edema  Pain in left ankle and joints of left foot     Problem List Patient Active Problem List   Diagnosis Date  Noted   Haglund's deformity  of left heel    GERD (gastroesophageal reflux disease) 05/20/2019   Hyperlipidemia 05/04/2012   Essential hypertension 03/05/2009   COUGH 12/27/2007   ALLERGIC RHINITIS 12/03/2007   DEPENDENT EDEMA 11/10/2007   TMJ PAIN 03/04/2007   HEADACHE 03/04/2007    Taimi Towe [address], [place], DPT 12/30/2021, 12:39 PM  [place] [address] [place], [place], [address] Phone: [phone]   Fax:  [phone]  Name: Heidi Crawford MRN: [id number] Date of Birth: 03/15/1961

## 2021-12-31 ENCOUNTER — Ambulatory Visit (INDEPENDENT_AMBULATORY_CARE_PROVIDER_SITE_OTHER): Payer: BC Managed Care – PPO | Admitting: Orthopedic Surgery

## 2021-12-31 DIAGNOSIS — M9260 Juvenile osteochondrosis of tarsus, unspecified ankle: Secondary | ICD-10-CM

## 2022-01-01 ENCOUNTER — Ambulatory Visit: Payer: BC Managed Care – PPO | Admitting: Physical Therapy

## 2022-01-01 DIAGNOSIS — R262 Difficulty in walking, not elsewhere classified: Secondary | ICD-10-CM

## 2022-01-01 DIAGNOSIS — M25672 Stiffness of left ankle, not elsewhere classified: Secondary | ICD-10-CM | POA: Diagnosis not present

## 2022-01-01 DIAGNOSIS — R600 Localized edema: Secondary | ICD-10-CM

## 2022-01-01 DIAGNOSIS — R2689 Other abnormalities of gait and mobility: Secondary | ICD-10-CM | POA: Diagnosis not present

## 2022-01-01 DIAGNOSIS — M25572 Pain in left ankle and joints of left foot: Secondary | ICD-10-CM

## 2022-01-01 DIAGNOSIS — M9260 Juvenile osteochondrosis of tarsus, unspecified ankle: Secondary | ICD-10-CM | POA: Diagnosis not present

## 2022-01-01 DIAGNOSIS — M6281 Muscle weakness (generalized): Secondary | ICD-10-CM | POA: Diagnosis not present

## 2022-01-01 NOTE — Therapy (Signed)
[place], [place], [address] Phone: [phone]   Fax:  [phone]  Physical Therapy Treatment  Patient Details  Name: Heidi Crawford MRN: [id number] Date of Birth: 01-30-1961 Referring Provider (PT): Newt Minion, MD  Rationale for Evaluation and Treatment Rehabilitation  Encounter Date: 01/01/2022   PT End of Session - 01/01/22 1110     Visit Number 7    Date for PT Re-Evaluation 01/23/22    Authorization Type BCBS    PT Start Time 1106    PT Stop Time 1145    PT Time Calculation (min) 39 min    Activity Tolerance Patient tolerated treatment well    Behavior During Therapy [facility] for tasks assessed/performed             Past Medical History:  Diagnosis Date   Allergy    seasonal   Dependent edema    GERD (gastroesophageal reflux disease)    Headache(784.0)    Hyperlipemia    Hyperlipidemia    Hypertension    Pneumonia    Squamous cell cancer of skin of forearm    right arm    TMJ pain dysfunction syndrome     Past Surgical History:  Procedure Laterality Date   COLONOSCOPY  09/08/2014   per Dr. Henrene Pastor, clear, repeat in 10 yrs    [place] Left 10/25/2021   Procedure: EXCISION LEFT HAGLUND'S;  Surgeon: Newt Minion, MD;  Location: [facility];  Service: Orthopedics;  Laterality: Left;   removal skin cancer  2014   from right arm per Dr. Delman Cheadle     There were no vitals filed for this visit.   Subjective Assessment - 01/01/22 1111     Subjective Pt states Dr. Sharol Given has cleared her to be out of the boot. She is now wearing shoe with 1 heel lift. Everything is looking good and healing good.    Pertinent History HTN    How long can you walk comfortably? limited to walking around the house with walker    Patient Stated Goals to be able to walk normally, to get rid of AD    Currently in Pain? No/denies                 [facility] PT Assessment - 01/01/22 0001       Assessment   Medical Diagnosis M92.60 (ICD-10-CM) - Haglund's deformity    Referring Provider (PT) Newt Minion, MD    Onset Date/Surgical Date 10/25/21    Hand Dominance Right      Precautions   Precautions Fall      Restrictions   LLE Weight Bearing Weight bearing as tolerated    Other Position/Activity Restrictions without boot; foot in shoe with 1 heel lift                           [facility] Adult PT Treatment/Exercise - 01/01/22 0001       Ambulation/Gait   Ambulation/Gait Assistance 4: Min guard    Ambulation Distance (Feet) 60 Feet    Assistive device Small based quad cane    Gait Pattern Step-to pattern;Step-through pattern   improved to step through pattern with increased confidence   Ambulation Surface Level;Indoor    Pre-Gait Activities heel/toe and weight shift on L LE 2x10; L LE weight shift tap R foot forward/back 2x10  Ankle Exercises: Standing   Other Standing Ankle Exercises hip abduction red tband 2x10    Other Standing Ankle Exercises hip extension red tband 2x10 red tband; hamstring curl red tband 2x10; tandem stance 2x30 sec                       PT Short Term Goals - 12/12/21 1238       PT SHORT TERM GOAL #1   Title Ind with initial HEP    Baseline -    Time 2    Period Weeks    Status New    Target Date 12/26/21      PT SHORT TERM GOAL #2   Title Pt able to ascend/descend her steps safely in CAM boot with or without AD and one UE assist.    Time 2    Period Weeks    Status New    Target Date 12/26/21               PT Long Term Goals - 12/12/21 1239       PT LONG TERM GOAL #1   Title Improve FOTO to 63 showing functional improvement    Baseline 43 at eval    Time 6    Period Weeks    Status New      PT LONG TERM GOAL #2   Title Patient able to ambulate community distances with a normal gait pattern and ascend/descend stairs without  difficulty.    Baseline -    Time 6    Period Weeks    Status New      PT LONG TERM GOAL #3   Title Improve B heel cords flexibility to >= 10 degrees to normalize gait.    Baseline -    Time 6    Period Weeks    Status New      PT LONG TERM GOAL #4   Title Demonstrate 5/5 R ankle strength    Baseline -    Time 6    Period Weeks    Status New      PT LONG TERM GOAL #5   Title Shuna will be independent with her long-term HEP at DC.    Baseline -    Time 6    Period Weeks    Status New                   Plan - 01/01/22 1127     Clinical Impression Statement Worked on gait and mobility without cam boot this session. Able to demonstrate safe amb with [facility] for short distance. Continued standing exercises to improve L LE stability.    Examination-Activity Limitations Locomotion Level;Stairs    Stability/Clinical Decision Making Stable/Uncomplicated    Rehab Potential Excellent    PT Frequency 2x / week    PT Duration 6 weeks    PT Treatment/Interventions ADLs/Self Care Home Management;Aquatic Therapy;Cryotherapy;Electrical Stimulation;Iontophoresis '4mg'$ /ml Dexamethasone;Moist Heat;Balance training;Therapeutic exercise;Therapeutic activities;Functional mobility training;Stair training;Gait training;Neuromuscular re-education;Patient/family education;Dry needling;Manual techniques;Taping;Vasopneumatic Device    PT Next Visit Plan Work on ROM/strength per protocol, progress HEP    PT Home Exercise Plan [phone]    Consulted and Agree with Plan of Care Patient             Patient will benefit from skilled therapeutic intervention in order to improve the following deficits and impairments:  Difficulty walking, Decreased range of motion, Decreased balance, Impaired flexibility, Increased edema, Decreased strength  Visit Diagnosis: Stiffness  of left ankle, not elsewhere classified  Other abnormalities of gait and mobility  Muscle weakness (generalized)  Difficulty  walking  Localized edema  Pain in left ankle and joints of left foot     Problem List Patient Active Problem List   Diagnosis Date Noted   Haglund's deformity of left heel    GERD (gastroesophageal reflux disease) 05/20/2019   Hyperlipidemia 05/04/2012   Essential hypertension 03/05/2009   COUGH 12/27/2007   ALLERGIC RHINITIS 12/03/2007   DEPENDENT EDEMA 11/10/2007   TMJ PAIN 03/04/2007   HEADACHE 03/04/2007    Elex Mainwaring [address] [place], PT, DPT 01/01/2022, 11:45 AM  [facility] [place], [place], [address] Phone: [phone]   Fax:  [phone]  Name: Heidi Crawford MRN: [id number] Date of Birth: 1960-10-17

## 2022-01-06 ENCOUNTER — Ambulatory Visit: Payer: BC Managed Care – PPO | Attending: Orthopedic Surgery | Admitting: Physical Therapy

## 2022-01-06 DIAGNOSIS — M25672 Stiffness of left ankle, not elsewhere classified: Secondary | ICD-10-CM | POA: Diagnosis not present

## 2022-01-06 DIAGNOSIS — R2689 Other abnormalities of gait and mobility: Secondary | ICD-10-CM | POA: Insufficient documentation

## 2022-01-06 DIAGNOSIS — M25572 Pain in left ankle and joints of left foot: Secondary | ICD-10-CM | POA: Insufficient documentation

## 2022-01-06 DIAGNOSIS — R600 Localized edema: Secondary | ICD-10-CM | POA: Insufficient documentation

## 2022-01-06 DIAGNOSIS — R262 Difficulty in walking, not elsewhere classified: Secondary | ICD-10-CM | POA: Insufficient documentation

## 2022-01-06 DIAGNOSIS — M6281 Muscle weakness (generalized): Secondary | ICD-10-CM | POA: Diagnosis not present

## 2022-01-06 NOTE — Therapy (Signed)
[place], [place], [address] Phone: [phone]   Fax:  [phone]  Physical Therapy Treatment  Patient Details  Name: Heidi Crawford MRN: [id number] Date of Birth: Dec 05, 1960 Referring Provider (PT): Newt Minion, MD   Encounter Date: 01/06/2022   PT End of Session - 01/06/22 0936     Visit Number 8    Date for PT Re-Evaluation 01/23/22    Authorization Type BCBS    PT Start Time 0932    PT Stop Time 1010    PT Time Calculation (min) 38 min    Activity Tolerance Patient tolerated treatment well    Behavior During Therapy [facility] for tasks assessed/performed             Past Medical History:  Diagnosis Date   Allergy    seasonal   Dependent edema    GERD (gastroesophageal reflux disease)    Headache(784.0)    Hyperlipemia    Hyperlipidemia    Hypertension    Pneumonia    Squamous cell cancer of skin of forearm    right arm    TMJ pain dysfunction syndrome     Past Surgical History:  Procedure Laterality Date   COLONOSCOPY  09/08/2014   per Dr. Henrene Pastor, clear, repeat in 10 yrs    [place] Left 10/25/2021   Procedure: EXCISION LEFT HAGLUND'S;  Surgeon: Newt Minion, MD;  Location: [place];  Service: Orthopedics;  Laterality: Left;   removal skin cancer  2014   from right arm per Dr. Delman Cheadle     There were no vitals filed for this visit.   Subjective Assessment - 01/06/22 0937     Subjective Pt states she had to use the boot [place] amb [place] at [facility]    Pertinent History HTN    How long can you walk comfortably? limited to walking around the house with walker    Patient Stated Goals to be able to walk normally, to get rid of AD    Currently in Pain? No/denies                [facility] PT Assessment - 01/06/22 0001       Assessment   Medical Diagnosis M92.60  (ICD-10-CM) - Haglund's deformity    Referring Provider (PT) Newt Minion, MD    Onset Date/Surgical Date 10/25/21    Hand Dominance Right                           [facility] Adult PT Treatment/Exercise - 01/06/22 0001       Ambulation/Gait   Ambulation/Gait Assistance 4: Min guard    Ambulation Distance (Feet) 450 Feet    Assistive device Small based quad cane    Gait Pattern Step-to pattern;Step-through pattern    Ambulation Surface Level;Indoor    Stairs Yes    Stairs Assistance 4: Min guard    Stair Management Technique One rail Right;No rails;Step to pattern;With cane    Number of Stairs 10   x2 sets; first set with cane + rail, 2nd set with cane only   Height of Stairs --   4 to 6"   Gait Comments Cues for increasing R step length      Knee/Hip Exercises: Standing   Lateral Step Up Left;2 sets;10 reps;Hand Hold: 2;Step Height: 4"  Forward Step Up Left;2 sets;10 reps;Hand Hold: 2;Step Height: 4"      Ankle Exercises: Aerobic   Nustep L4 x 5 min LEs only      Ankle Exercises: Seated   Other Seated Ankle Exercises red tband inv/ev/df/pf 2x10      Ankle Exercises: Standing   Other Standing Ankle Exercises hip abduction green tband 2x10    Other Standing Ankle Exercises hip extension green tband 2x10 green tband; tandem stance 2x30 sec                       PT Short Term Goals - 12/12/21 1238       PT SHORT TERM GOAL #1   Title Ind with initial HEP    Baseline -    Time 2    Period Weeks    Status New    Target Date 12/26/21      PT SHORT TERM GOAL #2   Title Pt able to ascend/descend her steps safely in CAM boot with or without AD and one UE assist.    Time 2    Period Weeks    Status New    Target Date 12/26/21               PT Long Term Goals - 12/12/21 1239       PT LONG TERM GOAL #1   Title Improve FOTO to 63 showing functional improvement    Baseline 43 at eval    Time 6    Period Weeks    Status New      PT  LONG TERM GOAL #2   Title Patient able to ambulate community distances with a normal gait pattern and ascend/descend stairs without difficulty.    Baseline -    Time 6    Period Weeks    Status New      PT LONG TERM GOAL #3   Title Improve B heel cords flexibility to >= 10 degrees to normalize gait.    Baseline -    Time 6    Period Weeks    Status New      PT LONG TERM GOAL #4   Title Demonstrate 5/5 R ankle strength    Baseline -    Time 6    Period Weeks    Status New      PT LONG TERM GOAL #5   Title Heidi Crawford will be independent with her long-term HEP at DC.    Baseline -    Time 6    Period Weeks    Status New                   Plan - 01/06/22 0109     Clinical Impression Statement Continued to work on progressive strengthening of L LE. Initiated exercises on stairs for L LE stability. Working to improve gait with SBQC.    Examination-Activity Limitations Locomotion Level;Stairs    Stability/Clinical Decision Making Stable/Uncomplicated    Rehab Potential Excellent    PT Frequency 2x / week    PT Duration 6 weeks    PT Treatment/Interventions ADLs/Self Care Home Management;Aquatic Therapy;Cryotherapy;Electrical Stimulation;Iontophoresis '4mg'$ /ml Dexamethasone;Moist Heat;Balance training;Therapeutic exercise;Therapeutic activities;Functional mobility training;Stair training;Gait training;Neuromuscular re-education;Patient/family education;Dry needling;Manual techniques;Taping;Vasopneumatic Device    PT Next Visit Plan Work on ROM/strength per protocol, progress HEP    PT Home Exercise Plan [phone]    Consulted and Agree with Plan of Care Patient  Patient will benefit from skilled therapeutic intervention in order to improve the following deficits and impairments:  Difficulty walking, Decreased range of motion, Decreased balance, Impaired flexibility, Increased edema, Decreased strength  Visit Diagnosis: Stiffness of left ankle, not elsewhere  classified  Other abnormalities of gait and mobility  Muscle weakness (generalized)  Difficulty walking  Localized edema  Pain in left ankle and joints of left foot     Problem List Patient Active Problem List   Diagnosis Date Noted   Haglund's deformity of left heel    GERD (gastroesophageal reflux disease) 05/20/2019   Hyperlipidemia 05/04/2012   Essential hypertension 03/05/2009   COUGH 12/27/2007   ALLERGIC RHINITIS 12/03/2007   DEPENDENT EDEMA 11/10/2007   TMJ PAIN 03/04/2007   HEADACHE 03/04/2007    Heidi Crawford April Ma L [place], PT, DPT 01/06/2022, 10:16 AM  [facility] [place], [place], [address] Phone: [phone]   Fax:  [phone]  Name: Heidi Crawford MRN: [id number] Date of Birth: 1960/12/29

## 2022-01-08 ENCOUNTER — Encounter: Payer: Self-pay | Admitting: Orthopedic Surgery

## 2022-01-08 NOTE — Progress Notes (Signed)
Office Visit Note   Patient: Heidi Crawford           Date of Birth: [birth year]           MRN: [id number] Visit Date: 12/31/2021              Requested by: Laurey Morale, MD [place],  [place] [address] PCP: Laurey Morale, MD  Chief Complaint  Patient presents with   Left Foot - Routine Post Op    10/25/21 excision left haglunds deformity      HPI: Patient is a 61 year old woman who is 2 months status post excision left Haglund's deformity.  She is currently ambulating with a fracture boot and walker.  Assessment & Plan: Visit Diagnoses:  1. Haglund's deformity     Plan: Patient may advance to regular shoewear with her walker.  She was given 2 heel lifts to unload stress from the Achilles with ambulation.  Follow-Up Instructions: Return in about 4 weeks (around 01/28/2022).   Ortho Exam  Patient is alert, oriented, no adenopathy, well-dressed, normal affect, normal respiratory effort. Examination incision is well-healed there is no redness no cellulitis the Achilles tendon is intact with good strength.  Imaging: No results found. No images are attached to the encounter.  Labs: Lab Results  Component Value Date   HGBA1C 5.4 06/11/2021     Lab Results  Component Value Date   ALBUMIN 4.4 06/11/2021   ALBUMIN 4.6 12/23/2018   ALBUMIN 4.2 11/13/2017    No results found for: "MG" No results found for: "VD25OH"  No results found for: "PREALBUMIN"    Latest Ref Rng & Units 10/25/2021    5:57 AM 06/11/2021   10:51 AM 12/23/2018    9:34 AM  CBC EXTENDED  WBC 4.0 - 10.5 K/uL 5.5  5.5  6.1   RBC 3.87 - 5.11 MIL/uL 4.91  4.76  4.86   Hemoglobin 12.0 - 15.0 g/dL 14.6  14.2  14.6   HCT 36.0 - 46.0 % 44.4  42.2  43.4   Platelets 150 - 400 K/uL 270  258.0  265.0   NEUT# 1.4 - 7.7 K/uL  3.6  4.3   Lymph# 0.7 - 4.0 K/uL  1.4  1.1      There is no height or weight on file to calculate BMI.  Orders:  No orders of the defined types were placed in  this encounter.  No orders of the defined types were placed in this encounter.    Procedures: No procedures performed  Clinical Data: No additional findings.  ROS:  All other systems negative, except as noted in the HPI. Review of Systems  Objective: Vital Signs: There were no vitals taken for this visit.  Specialty Comments:  No specialty comments available.  PMFS History: Patient Active Problem List   Diagnosis Date Noted   Haglund's deformity of left heel    GERD (gastroesophageal reflux disease) 05/20/2019   Hyperlipidemia 05/04/2012   Essential hypertension 03/05/2009   COUGH 12/27/2007   ALLERGIC RHINITIS 12/03/2007   DEPENDENT EDEMA 11/10/2007   TMJ PAIN 03/04/2007   HEADACHE 03/04/2007   Past Medical History:  Diagnosis Date   Allergy    seasonal   Dependent edema    GERD (gastroesophageal reflux disease)    Headache(784.0)    Hyperlipemia    Hyperlipidemia    Hypertension    Pneumonia    Squamous cell cancer of skin of forearm    right arm  TMJ pain dysfunction syndrome     Family History  Problem Relation Age of Onset   Alcohol abuse Unknown        fhx   Arthritis Unknown        fhx   Hyperlipidemia Unknown        fhx   Hypertension Unknown        fhx   Heart disease Unknown        fhx   Stroke Unknown        fhx   Hypertension Mother    Hypertension Father    Colon cancer Neg Hx    Rectal cancer Neg Hx    Stomach cancer Neg Hx     Past Surgical History:  Procedure Laterality Date   COLONOSCOPY  09/08/2014   per Dr. Henrene Pastor, clear, repeat in 10 yrs    DENTAL SURGERY     EXCISION HAGLUND'S DEFORMITY WITH ACHILLES TENDON REPAIR Left 10/25/2021   Procedure: EXCISION LEFT HAGLUND'S;  Surgeon: Newt Minion, MD;  Location: [place];  Service: Orthopedics;  Laterality: Left;   removal skin cancer  2014   from right arm per Dr. Delman Cheadle    Social History   Occupational History   Not on file  Tobacco Use   Smoking status: Former     Types: Cigarettes    Quit date: 07/07/2000    Years since quitting: 21.5   Smokeless tobacco: Never  Vaping Use   Vaping Use: Never used  Substance and Sexual Activity   Alcohol use: No    Alcohol/week: 0.0 standard drinks of alcohol   Drug use: No   Sexual activity: Not on file

## 2022-01-09 ENCOUNTER — Ambulatory Visit: Payer: BC Managed Care – PPO | Admitting: Physical Therapy

## 2022-01-09 DIAGNOSIS — R262 Difficulty in walking, not elsewhere classified: Secondary | ICD-10-CM

## 2022-01-09 DIAGNOSIS — M6281 Muscle weakness (generalized): Secondary | ICD-10-CM

## 2022-01-09 DIAGNOSIS — M25672 Stiffness of left ankle, not elsewhere classified: Secondary | ICD-10-CM

## 2022-01-09 DIAGNOSIS — R600 Localized edema: Secondary | ICD-10-CM | POA: Diagnosis not present

## 2022-01-09 DIAGNOSIS — R2689 Other abnormalities of gait and mobility: Secondary | ICD-10-CM

## 2022-01-09 DIAGNOSIS — M25572 Pain in left ankle and joints of left foot: Secondary | ICD-10-CM | POA: Diagnosis not present

## 2022-01-09 NOTE — Therapy (Signed)
[place], [place], [address] Phone: [phone]   Fax:  [phone]  Physical Therapy Treatment  Patient Details  Name: Heidi Crawford MRN: [id number] Date of Birth: 07-20-1960 Referring Provider (PT): Newt Minion, MD  Rationale for Evaluation and Treatment Rehabilitation  Encounter Date: 01/09/2022   PT End of Session - 01/09/22 0935     Visit Number 9    Date for PT Re-Evaluation 01/23/22    Authorization Type BCBS    PT Start Time 0932    PT Stop Time 1015    PT Time Calculation (min) 43 min    Activity Tolerance Patient tolerated treatment well    Behavior During Therapy [facility] for tasks assessed/performed             Past Medical History:  Diagnosis Date   Allergy    seasonal   Dependent edema    GERD (gastroesophageal reflux disease)    Headache(784.0)    Hyperlipemia    Hyperlipidemia    Hypertension    Pneumonia    Squamous cell cancer of skin of forearm    right arm    TMJ pain dysfunction syndrome     Past Surgical History:  Procedure Laterality Date   COLONOSCOPY  09/08/2014   per Dr. Henrene Pastor, clear, repeat in 10 yrs    [place] Left 10/25/2021   Procedure: EXCISION LEFT HAGLUND'S;  Surgeon: Newt Minion, MD;  Location: [place];  Service: Orthopedics;  Laterality: Left;   removal skin cancer  2014   from right arm per Dr. Delman Cheadle     There were no vitals filed for this visit.   Subjective Assessment - 01/09/22 0936     Subjective Pt reports she's been doing okay. Notes continued L ankle swelling.    Pertinent History HTN    How long can you walk comfortably? limited to walking around the house with walker    Patient Stated Goals to be able to walk normally, to get rid of AD    Currently in Pain? No/denies                [facility] PT Assessment - 01/09/22 0001       Assessment   Medical  Diagnosis M92.60 (ICD-10-CM) - Haglund's deformity    Referring Provider (PT) Newt Minion, MD    Onset Date/Surgical Date 10/25/21    Hand Dominance Right      Restrictions   LLE Weight Bearing Weight bearing as tolerated    Other Position/Activity Restrictions without boot; foot in shoe with 1 heel lift                           [facility] Adult PT Treatment/Exercise - 01/09/22 0001       Knee/Hip Exercises: Machines for Strengthening   Total Gym Leg Press 3 plates soleus strengthening 2x10; 3 plates SL leg press 2x8      Knee/Hip Exercises: Standing   Forward Step Up Left;2 sets;10 reps;Hand Hold: 2;Step Height: 4"   initially with 2 hand hold and then intermittent hand holds   Forward Step Up Limitations runner's step up      Ankle Exercises: Aerobic   Nustep L4 x 5 min LEs only      Ankle Exercises: Standing   SLS 2x30 sec with counter support    Heel Raises  Right;Left;20 reps    Other Standing Ankle Exercises tandem stance 2x30 sec    Other Standing Ankle Exercises side stepping 2x15'; backwards stepping 2x15'                       PT Short Term Goals - 12/12/21 1238       PT SHORT TERM GOAL #1   Title Ind with initial HEP    Baseline -    Time 2    Period Weeks    Status New    Target Date 12/26/21      PT SHORT TERM GOAL #2   Title Pt able to ascend/descend her steps safely in CAM boot with or without AD and one UE assist.    Time 2    Period Weeks    Status New    Target Date 12/26/21               PT Long Term Goals - 12/12/21 1239       PT LONG TERM GOAL #1   Title Improve FOTO to 63 showing functional improvement    Baseline 43 at eval    Time 6    Period Weeks    Status New      PT LONG TERM GOAL #2   Title Patient able to ambulate community distances with a normal gait pattern and ascend/descend stairs without difficulty.    Baseline -    Time 6    Period Weeks    Status New      PT LONG TERM GOAL #3    Title Improve B heel cords flexibility to >= 10 degrees to normalize gait.    Baseline -    Time 6    Period Weeks    Status New      PT LONG TERM GOAL #4   Title Demonstrate 5/5 R ankle strength    Baseline -    Time 6    Period Weeks    Status New      PT LONG TERM GOAL #5   Title Heidi Crawford will be independent with her long-term HEP at DC.    Baseline -    Time 6    Period Weeks    Status New                   Plan - 01/09/22 1009     Clinical Impression Statement Session focused on initiating dynamic balance activities. Continued to work on L LE strengthening and stability.    Examination-Activity Limitations Locomotion Level;Stairs    Stability/Clinical Decision Making Stable/Uncomplicated    Rehab Potential Excellent    PT Frequency 2x / week    PT Duration 6 weeks    PT Treatment/Interventions ADLs/Self Care Home Management;Aquatic Therapy;Cryotherapy;Electrical Stimulation;Iontophoresis '4mg'$ /ml Dexamethasone;Moist Heat;Balance training;Therapeutic exercise;Therapeutic activities;Functional mobility training;Stair training;Gait training;Neuromuscular re-education;Patient/family education;Dry needling;Manual techniques;Taping;Vasopneumatic Device    PT Next Visit Plan Work on ROM/strength per protocol, progress HEP    PT Home Exercise Plan [phone]    Consulted and Agree with Plan of Care Patient             Patient will benefit from skilled therapeutic intervention in order to improve the following deficits and impairments:  Difficulty walking, Decreased range of motion, Decreased balance, Impaired flexibility, Increased edema, Decreased strength  Visit Diagnosis: Stiffness of left ankle, not elsewhere classified  Other abnormalities of gait and mobility  Muscle weakness (generalized)  Difficulty walking  Localized edema  Pain in left ankle and joints of left foot     Problem List Patient Active Problem List   Diagnosis Date Noted   Haglund's  deformity of left heel    GERD (gastroesophageal reflux disease) 05/20/2019   Hyperlipidemia 05/04/2012   Essential hypertension 03/05/2009   COUGH 12/27/2007   ALLERGIC RHINITIS 12/03/2007   DEPENDENT EDEMA 11/10/2007   TMJ PAIN 03/04/2007   HEADACHE 03/04/2007    Zander Ingham April Ma L [place], PT 01/09/2022, 10:11 AM  [facility] [place], [place], [address] Phone: [phone]   Fax:  [phone]  Name: Heidi Crawford MRN: [id number] Date of Birth: [birth year]

## 2022-01-13 ENCOUNTER — Ambulatory Visit: Payer: BC Managed Care – PPO | Admitting: Physical Therapy

## 2022-01-13 DIAGNOSIS — R262 Difficulty in walking, not elsewhere classified: Secondary | ICD-10-CM | POA: Diagnosis not present

## 2022-01-13 DIAGNOSIS — M25672 Stiffness of left ankle, not elsewhere classified: Secondary | ICD-10-CM | POA: Diagnosis not present

## 2022-01-13 DIAGNOSIS — R2689 Other abnormalities of gait and mobility: Secondary | ICD-10-CM | POA: Diagnosis not present

## 2022-01-13 DIAGNOSIS — M6281 Muscle weakness (generalized): Secondary | ICD-10-CM | POA: Diagnosis not present

## 2022-01-13 DIAGNOSIS — M25572 Pain in left ankle and joints of left foot: Secondary | ICD-10-CM

## 2022-01-13 DIAGNOSIS — R600 Localized edema: Secondary | ICD-10-CM | POA: Diagnosis not present

## 2022-01-13 NOTE — Therapy (Signed)
[place], [place], [address] Phone: [phone]   Fax:  [phone]  Physical Therapy Treatment  Patient Details  Name: Heidi Crawford MRN: [id number] Date of Birth: [birth year] Referring Provider (PT): Newt Minion, MD  Rationale for Evaluation and Treatment Rehabilitation  Encounter Date: 01/13/2022   PT End of Session - 01/13/22 0934     Visit Number 10    Date for PT Re-Evaluation 01/23/22    Authorization Type BCBS    PT Start Time 0931    PT Stop Time 1012    PT Time Calculation (min) 41 min    Activity Tolerance Patient tolerated treatment well    Behavior During Therapy [facility] for tasks assessed/performed             Past Medical History:  Diagnosis Date   Allergy    seasonal   Dependent edema    GERD (gastroesophageal reflux disease)    Headache(784.0)    Hyperlipemia    Hyperlipidemia    Hypertension    Pneumonia    Squamous cell cancer of skin of forearm    right arm    TMJ pain dysfunction syndrome     Past Surgical History:  Procedure Laterality Date   COLONOSCOPY  09/08/2014   per Dr. Henrene Pastor, clear, repeat in 10 yrs    [place] Left 10/25/2021   Procedure: EXCISION LEFT HAGLUND'S;  Surgeon: Newt Minion, MD;  Location: [place];  Service: Orthopedics;  Laterality: Left;   removal skin cancer  2014   from right arm per Dr. Delman Cheadle     There were no vitals filed for this visit.   Subjective Assessment - 01/13/22 0935     Subjective Nothing new or different. Working on her exercises.    Pertinent History HTN    How long can you walk comfortably? limited to walking around the house with walker    Patient Stated Goals to be able to walk normally, to get rid of AD    Currently in Pain? No/denies                [facility] PT Assessment - 01/13/22 0001       Assessment   Medical Diagnosis M92.60  (ICD-10-CM) - Haglund's deformity    Referring Provider (PT) Newt Minion, MD    Onset Date/Surgical Date 10/25/21    Hand Dominance Right    Next MD Visit 01/26/22                           [facility] Adult PT Treatment/Exercise - 01/13/22 0001       Ankle Exercises: Aerobic   Recumbent Bike L1 x 5 min LEs      Ankle Exercises: Seated   Other Seated Ankle Exercises green tband inv/ev/df/pf      Ankle Exercises: Standing   SLS single leg clock 2x10 forward, side and back    Heel Raises --    Other Standing Ankle Exercises feet together on foam: static 2x30 sec, with head turns & then head nods 2x30 sec    Other Standing Ankle Exercises side stepping 2x15'; backwards stepping 2x15'      Ankle Exercises: Stretches   Soleus Stretch 2 reps;30 seconds    Gastroc Stretch 30 seconds;2 reps  PT Short Term Goals - 12/12/21 1238       PT SHORT TERM GOAL #1   Title Ind with initial HEP    Baseline -    Time 2    Period Weeks    Status New    Target Date 12/26/21      PT SHORT TERM GOAL #2   Title Pt able to ascend/descend her steps safely in CAM boot with or without AD and one UE assist.    Time 2    Period Weeks    Status New    Target Date 12/26/21               PT Long Term Goals - 12/12/21 1239       PT LONG TERM GOAL #1   Title Improve FOTO to 63 showing functional improvement    Baseline 43 at eval    Time 6    Period Weeks    Status New      PT LONG TERM GOAL #2   Title Patient able to ambulate community distances with a normal gait pattern and ascend/descend stairs without difficulty.    Baseline -    Time 6    Period Weeks    Status New      PT LONG TERM GOAL #3   Title Improve B heel cords flexibility to >= 10 degrees to normalize gait.    Baseline -    Time 6    Period Weeks    Status New      PT LONG TERM GOAL #4   Title Demonstrate 5/5 R ankle strength    Baseline -    Time 6    Period  Weeks    Status New      PT LONG TERM GOAL #5   Title [name] will be independent with her long-term HEP at DC.    Baseline -    Time 6    Period Weeks    Status New                   Plan - 01/13/22 9833     Clinical Impression Statement Continued to work on L LE stability & balance. Pt remains guarded with L LE weightbearing. Able to amb short distances without a/d.    Examination-Activity Limitations Locomotion Level;Stairs    Stability/Clinical Decision Making Stable/Uncomplicated    Rehab Potential Excellent    PT Frequency 2x / week    PT Duration 6 weeks    PT Treatment/Interventions ADLs/Self Care Home Management;Aquatic Therapy;Cryotherapy;Electrical Stimulation;Iontophoresis '4mg'$ /ml Dexamethasone;Moist Heat;Balance training;Therapeutic exercise;Therapeutic activities;Functional mobility training;Stair training;Gait training;Neuromuscular re-education;Patient/family education;Dry needling;Manual techniques;Taping;Vasopneumatic Device    PT Next Visit Plan Work on ROM/strength per protocol, progress HEP    PT Home Exercise Plan [phone]    Consulted and Agree with Plan of Care Patient             Patient will benefit from skilled therapeutic intervention in order to improve the following deficits and impairments:  Difficulty walking, Decreased range of motion, Decreased balance, Impaired flexibility, Increased edema, Decreased strength  Visit Diagnosis: Stiffness of left ankle, not elsewhere classified  Other abnormalities of gait and mobility  Muscle weakness (generalized)  Difficulty walking  Localized edema  Pain in left ankle and joints of left foot     Problem List Patient Active Problem List   Diagnosis Date Noted   Haglund's deformity of left heel    GERD (gastroesophageal reflux disease) 05/20/2019  Hyperlipidemia 05/04/2012   Essential hypertension 03/05/2009   COUGH 12/27/2007   ALLERGIC RHINITIS 12/03/2007   DEPENDENT EDEMA 11/10/2007    TMJ PAIN 03/04/2007   HEADACHE 03/04/2007    Krishawn Vanderweele [address] [place], [place], DPT 01/13/2022, 10:14 AM  [facility] [place], [place], [address] Phone: [phone]   Fax:  [phone]  Name: Heidi Crawford MRN: [id number] Date of Birth: [birth year]

## 2022-01-16 ENCOUNTER — Ambulatory Visit: Payer: BC Managed Care – PPO | Admitting: Physical Therapy

## 2022-01-16 DIAGNOSIS — R262 Difficulty in walking, not elsewhere classified: Secondary | ICD-10-CM

## 2022-01-16 DIAGNOSIS — M6281 Muscle weakness (generalized): Secondary | ICD-10-CM | POA: Diagnosis not present

## 2022-01-16 DIAGNOSIS — M25672 Stiffness of left ankle, not elsewhere classified: Secondary | ICD-10-CM | POA: Diagnosis not present

## 2022-01-16 DIAGNOSIS — M25572 Pain in left ankle and joints of left foot: Secondary | ICD-10-CM

## 2022-01-16 DIAGNOSIS — R2689 Other abnormalities of gait and mobility: Secondary | ICD-10-CM

## 2022-01-16 DIAGNOSIS — R600 Localized edema: Secondary | ICD-10-CM | POA: Diagnosis not present

## 2022-01-16 NOTE — Therapy (Signed)
[place], [place], [address] Phone: [phone]   Fax:  [phone]  Physical Therapy Treatment and Re-Cert  Patient Details  Name: Heidi Crawford MRN: [id number] Date of Birth: April 13, 1961 Referring Provider (PT): Newt Minion, MD  Rationale for Evaluation and Treatment Rehabilitation  Encounter Date: 01/16/2022   PT End of Session - 01/16/22 0845     Visit Number 11    Date for PT Re-Evaluation 02/27/22    Authorization Type BCBS    PT Start Time 0845    PT Stop Time 0935    PT Time Calculation (min) 50 min    Activity Tolerance Patient tolerated treatment well    Behavior During Therapy [facility] for tasks assessed/performed             Past Medical History:  Diagnosis Date   Allergy    seasonal   Dependent edema    GERD (gastroesophageal reflux disease)    Headache(784.0)    Hyperlipemia    Hyperlipidemia    Hypertension    Pneumonia    Squamous cell cancer of skin of forearm    right arm    TMJ pain dysfunction syndrome     Past Surgical History:  Procedure Laterality Date   COLONOSCOPY  09/08/2014   per Dr. Henrene Pastor, clear, repeat in 10 yrs    [place] Left 10/25/2021   Procedure: EXCISION LEFT HAGLUND'S;  Surgeon: Newt Minion, MD;  Location: [place];  Service: Orthopedics;  Laterality: Left;   removal skin cancer  2014   from right arm per Dr. Delman Cheadle     There were no vitals filed for this visit.   Subjective Assessment - 01/16/22 0849     Subjective Pt states she had a hard time doing the clock at home. Has been doing okay. Did note some increased swelling after exercises on Monday.    Pertinent History HTN    How long can you walk comfortably? limited to walking around the house with walker    Patient Stated Goals to be able to walk normally, to get rid of AD    Currently in Pain? No/denies                 [facility] PT Assessment - 01/16/22 0001       Assessment   Medical Diagnosis M92.60 (ICD-10-CM) - Haglund's deformity    Referring Provider (PT) Newt Minion, MD    Onset Date/Surgical Date 10/25/21    Hand Dominance Right    Next MD Visit 01/26/22                           [facility] Adult PT Treatment/Exercise - 01/16/22 0001       Ambulation/Gait   Ambulation Distance (Feet) 300 Feet    Assistive device Small based quad cane    Gait Pattern Step-through pattern    Ambulation Surface Level;Unlevel;Indoor;Outdoor;Grass    Gait Comments amb on sidewalk and grass on small incline/decline      Ankle Exercises: Aerobic   Recumbent Bike L1 x 5 min LEs      Ankle Exercises: Stretches   Soleus Stretch 2 reps;30 seconds    Gastroc Stretch 30 seconds;2 reps      Ankle Exercises: Standing   SLS single leg clock 2x10 forward, side and back   with foot on wash  cloth   Other Standing Ankle Exercises R foot on step 2x30 sec    Other Standing Ankle Exercises side stepping 4x6'; backwards stepping 4x6', forwards stepping 4x6'                       PT Short Term Goals - 12/12/21 1238       PT SHORT TERM GOAL #1   Title Ind with initial HEP    Baseline -    Time 2    Period Weeks    Status New    Target Date 12/26/21      PT SHORT TERM GOAL #2   Title Pt able to ascend/descend her steps safely in CAM boot with or without AD and one UE assist.    Time 2    Period Weeks    Status New    Target Date 12/26/21             Previous:   PT Long Term Goals - 01/16/22 0927       PT LONG TERM GOAL #1   Title Improve FOTO to 63 showing functional improvement    Baseline 43 on 01/09/22    Time 6    Period Weeks    Status On-going    Target Date      PT LONG TERM GOAL #2   Title Patient able to ambulate community distances with a normal gait pattern and ascend/descend stairs without difficulty.    Baseline -    Time 6    Period Weeks     Status On-going    Target Date       PT LONG TERM GOAL #3   Title Improve B heel cords flexibility to >= 10 degrees to normalize gait.    Baseline -    Time 6    Period Weeks    Status On-going    Target Date       PT LONG TERM GOAL #4   Title Demonstrate 5/5 R ankle strength    Baseline -    Time 6    Period Weeks    Status On-going    Target Date       PT LONG TERM GOAL #5   Title Heidi Crawford will be independent with her long-term HEP at DC.    Baseline -    Time 6    Period Weeks    Status On-going    Target Date              Revised:  PT Long Term Goals - 01/16/22 0927       PT LONG TERM GOAL #1   Title Improve FOTO to 45 showing functional improvement    Baseline 43 on 01/09/22    Time 6    Period Weeks    Status Revised    Target Date 02/27/22      PT LONG TERM GOAL #2   Title Patient able to ambulate community distances with a normal gait pattern and ascend/descend stairs without difficulty.    Baseline -    Time 6    Period Weeks    Status Revised    Target Date 02/27/22      PT LONG TERM GOAL #3   Title Improve B heel cords flexibility to >= 10 degrees to normalize gait.    Baseline -    Time 6    Period Weeks    Status Revised    Target Date 02/27/22  PT LONG TERM GOAL #4   Title Demonstrate 5/5 R ankle strength    Baseline -    Time 6    Period Weeks    Status Revised    Target Date 02/27/22      PT LONG TERM GOAL #5   Title Heidi Crawford will be independent with her long-term HEP at DC.    Baseline -    Time 6    Period Weeks    Status Revised    Target Date 02/27/22                   Plan - 01/16/22 0916     Clinical Impression Statement Session continues to focus on improving L LE weight bearing and stability. Initiated ambulating on slight inclines/declines and various surfaces. Pt will be following up with Dr. Sharol Given 01/28/22. Pt is still not yet released fully from the heel lift. She has not yet met her LTGs but is making good  progress towards them. She would benefit from additional PT for full return to prior activities.    Examination-Activity Limitations Locomotion Level;Stairs    Stability/Clinical Decision Making Stable/Uncomplicated    Rehab Potential Excellent    PT Frequency 2x / week    PT Duration 6 weeks    PT Treatment/Interventions ADLs/Self Care Home Management;Aquatic Therapy;Cryotherapy;Electrical Stimulation;Iontophoresis 70m/ml Dexamethasone;Moist Heat;Balance training;Therapeutic exercise;Therapeutic activities;Functional mobility training;Stair training;Gait training;Neuromuscular re-education;Patient/family education;Dry needling;Manual techniques;Taping;Vasopneumatic Device    PT Next Visit Plan Work on ROM/strength per protocol, progress HEP    PT Home Exercise Plan [id number]   Consulted and Agree with Plan of Care Patient             Patient will benefit from skilled therapeutic intervention in order to improve the following deficits and impairments:  Difficulty walking, Decreased range of motion, Decreased balance, Impaired flexibility, Increased edema, Decreased strength  Visit Diagnosis: Stiffness of left ankle, not elsewhere classified  Other abnormalities of gait and mobility  Muscle weakness (generalized)  Difficulty walking  Localized edema  Pain in left ankle and joints of left foot     Problem List Patient Active Problem List   Diagnosis Date Noted   Haglund's deformity of left heel    GERD (gastroesophageal reflux disease) 05/20/2019   Hyperlipidemia 05/04/2012   Essential hypertension 03/05/2009   COUGH 12/27/2007   ALLERGIC RHINITIS 12/03/2007   DEPENDENT EDEMA 11/10/2007   TMJ PAIN 03/04/2007   HEADACHE 03/04/2007    Gellen April Ma L NHelena PT, DPT 01/16/2022, 9:52 AM  [facility] [address]Phone: [phone]  Fax:  [phone] Name: BMackenzey CrownoverMRN:  [id number] of Birth: 408-27-1962

## 2022-01-20 ENCOUNTER — Ambulatory Visit: Payer: BC Managed Care – PPO | Admitting: Physical Therapy

## 2022-01-20 DIAGNOSIS — M25672 Stiffness of left ankle, not elsewhere classified: Secondary | ICD-10-CM

## 2022-01-20 DIAGNOSIS — R2689 Other abnormalities of gait and mobility: Secondary | ICD-10-CM

## 2022-01-20 DIAGNOSIS — R262 Difficulty in walking, not elsewhere classified: Secondary | ICD-10-CM | POA: Diagnosis not present

## 2022-01-20 DIAGNOSIS — R600 Localized edema: Secondary | ICD-10-CM | POA: Diagnosis not present

## 2022-01-20 DIAGNOSIS — M6281 Muscle weakness (generalized): Secondary | ICD-10-CM

## 2022-01-20 DIAGNOSIS — M25572 Pain in left ankle and joints of left foot: Secondary | ICD-10-CM

## 2022-01-20 NOTE — Therapy (Signed)
[place], [place], [address] Phone: [phone]   Fax:  [phone]  Physical Therapy Treatment  Patient Details  Name: Heidi Crawford MRN: [id number] Date of Birth: 1961-03-18 Referring Provider (PT): Heidi Minion, MD  Rationale for Evaluation and Treatment Rehabilitation  Encounter Date: 01/20/2022   PT End of Session - 01/20/22 0849     Visit Number 12    Date for PT Re-Evaluation 02/27/22    Authorization Type BCBS    PT Start Time [phone]    PT Stop Time 0930    PT Time Calculation (min) 43 min    Activity Tolerance Patient tolerated treatment well    Behavior During Therapy Heidi Crawford for tasks assessed/performed             Past Medical History:  Diagnosis Date   Allergy    seasonal   Dependent edema    GERD (gastroesophageal reflux disease)    Headache(784.0)    Hyperlipemia    Hyperlipidemia    Hypertension    Pneumonia    Squamous cell cancer of skin of forearm    right arm    TMJ pain dysfunction syndrome     Past Surgical History:  Procedure Laterality Date   COLONOSCOPY  09/08/2014   per Dr. Henrene Crawford, clear, repeat in 10 yrs    [place] Left 10/25/2021   Procedure: EXCISION LEFT HAGLUND'S;  Surgeon: Heidi Minion, MD;  Location: [facility];  Service: Orthopedics;  Laterality: Left;   removal skin cancer  2014   from right arm per Dr. Delman Crawford     There were no vitals filed for this visit.   Subjective Assessment - 01/20/22 0849     Subjective Pt reports she walked a lot this weekend. Has been doing well.    Pertinent History HTN    How long can you walk comfortably? limited to walking around the house with walker    Patient Stated Goals to be able to walk normally, to get rid of AD    Currently in Pain? No/denies                [facility] PT Assessment - 01/20/22 0001       Assessment   Medical Diagnosis  M92.60 (ICD-10-CM) - Haglund's deformity    Referring Provider (PT) Heidi Minion, MD    Onset Date/Surgical Date 10/25/21    Hand Dominance Right    Next MD Visit 01/26/22                           [facility] Adult PT Treatment/Exercise - 01/20/22 0001       Ambulation/Gait   Ambulation Distance (Feet) 360 Feet    Assistive device None    Gait Pattern Step-through pattern    Ambulation Surface Level;Indoor      Modalities   Modalities Vasopneumatic      Vasopneumatic   Number Minutes Vasopneumatic  10 minutes    Vasopnuematic Location  Ankle    Vasopneumatic Pressure Medium    Vasopneumatic Temperature  34      Ankle Exercises: Aerobic   Nustep L5 x  min LEs only      Ankle Exercises: Seated   Other Seated Ankle Exercises blue tband inv/ev/df/pf 2x10      Ankle Exercises: Standing   SLS single leg clock x10 forward, side  and back with wash rag    Other Standing Ankle Exercises yellow tband: side stepping 2x15'; backwards stepping 2x15', forwards stepping 2x15'                       PT Short Term Goals - 12/12/21 1238       PT SHORT TERM GOAL #1   Title Ind with initial HEP    Baseline -    Time 2    Period Weeks    Status New    Target Date 12/26/21      PT SHORT TERM GOAL #2   Title Pt able to ascend/descend her steps safely in CAM boot with or without AD and one UE assist.    Time 2    Period Weeks    Status New    Target Date 12/26/21               PT Long Term Goals - 01/16/22 0927       PT LONG TERM GOAL #1   Title Improve FOTO to 63 showing functional improvement    Baseline 43 on 01/09/22    Time 6    Period Weeks    Status Revised    Target Date 02/27/22      PT LONG TERM GOAL #2   Title Patient able to ambulate community distances with a normal gait pattern and ascend/descend stairs without difficulty.    Baseline -    Time 6    Period Weeks    Status Revised    Target Date 02/27/22      PT LONG TERM  GOAL #3   Title Improve B heel cords flexibility to >= 10 degrees to normalize gait.    Baseline -    Time 6    Period Weeks    Status Revised    Target Date 02/27/22      PT LONG TERM GOAL #4   Title Demonstrate 5/5 R ankle strength    Baseline -    Time 6    Period Weeks    Status Revised    Target Date 02/27/22      PT LONG TERM GOAL #5   Title Heidi Crawford will be independent with her long-term HEP at DC.    Baseline -    Time 6    Period Weeks    Status Revised    Target Date 02/27/22                   Plan - 01/20/22 0906     Clinical Impression Statement Treatment focused on progressing LE strengthening. Continued to work on L LE weight bearing and stability.    Examination-Activity Limitations Locomotion Level;Stairs    Stability/Clinical Decision Making Stable/Uncomplicated    Rehab Potential Excellent    PT Frequency 2x / week    PT Duration 6 weeks    PT Treatment/Interventions ADLs/Self Care Home Management;Aquatic Therapy;Cryotherapy;Electrical Stimulation;Iontophoresis '4mg'$ /ml Dexamethasone;Moist Heat;Balance training;Therapeutic exercise;Therapeutic activities;Functional mobility training;Stair training;Gait training;Neuromuscular re-education;Patient/family education;Dry needling;Manual techniques;Taping;Vasopneumatic Device    PT Next Visit Plan Work on ROM/strength per protocol, progress HEP    PT Home Exercise Plan [phone]    Consulted and Agree with Plan of Care Patient             Patient will benefit from skilled therapeutic intervention in order to improve the following deficits and impairments:  Difficulty walking, Decreased range of motion, Decreased balance, Impaired flexibility, Increased edema, Decreased strength  Visit Diagnosis: Stiffness of left ankle, not elsewhere classified  Other abnormalities of gait and mobility  Muscle weakness (generalized)  Difficulty walking  Localized edema  Pain in left ankle and joints of left  foot     Problem List Patient Active Problem List   Diagnosis Date Noted   Haglund's deformity of left heel    GERD (gastroesophageal reflux disease) 05/20/2019   Hyperlipidemia 05/04/2012   Essential hypertension 03/05/2009   COUGH 12/27/2007   ALLERGIC RHINITIS 12/03/2007   DEPENDENT EDEMA 11/10/2007   TMJ PAIN 03/04/2007   HEADACHE 03/04/2007    Heidi Crawford [address] [place], PT, DPT 01/20/2022, 9:24 AM  [facility] [place], [place], [address] Phone: [phone]   Fax:  [phone]  Name: Heidi Crawford MRN: [id number] Date of Birth: 29-Jul-1960

## 2022-01-23 ENCOUNTER — Encounter: Payer: Self-pay | Admitting: Physical Therapy

## 2022-01-23 ENCOUNTER — Ambulatory Visit: Payer: BC Managed Care – PPO | Admitting: Physical Therapy

## 2022-01-23 DIAGNOSIS — R262 Difficulty in walking, not elsewhere classified: Secondary | ICD-10-CM

## 2022-01-23 DIAGNOSIS — M25572 Pain in left ankle and joints of left foot: Secondary | ICD-10-CM | POA: Diagnosis not present

## 2022-01-23 DIAGNOSIS — R600 Localized edema: Secondary | ICD-10-CM | POA: Diagnosis not present

## 2022-01-23 DIAGNOSIS — R2689 Other abnormalities of gait and mobility: Secondary | ICD-10-CM

## 2022-01-23 DIAGNOSIS — M25672 Stiffness of left ankle, not elsewhere classified: Secondary | ICD-10-CM

## 2022-01-23 DIAGNOSIS — M6281 Muscle weakness (generalized): Secondary | ICD-10-CM | POA: Diagnosis not present

## 2022-01-23 NOTE — Therapy (Signed)
OUTPATIENT PHYSICAL THERAPY TREATMENT NOTE   Patient Name: Heidi Crawford MRN: [id number] DOB:09-01-1960, 61 y.o., female Today's Date: 01/23/2022  PCP: Maia Petties PROVIDER: Meridee Score   PT End of Session - 01/23/22 0849     Visit Number 13    Date for PT Re-Evaluation 02/27/22    Authorization Type BCBS    PT Start Time [phone]    PT Stop Time 0930    PT Time Calculation (min) 40 min    Activity Tolerance Patient tolerated treatment well    Behavior During Therapy WFL for tasks assessed/performed             Past Medical History:  Diagnosis Date   Allergy    seasonal   Dependent edema    GERD (gastroesophageal reflux disease)    Headache(784.0)    Hyperlipemia    Hyperlipidemia    Hypertension    Pneumonia    Squamous cell cancer of skin of forearm    right arm    TMJ pain dysfunction syndrome    Past Surgical History:  Procedure Laterality Date   COLONOSCOPY  09/08/2014   per Dr. Henrene Pastor, clear, repeat in 10 yrs    [place] Left 10/25/2021   Procedure: EXCISION LEFT HAGLUND'S;  Surgeon: Newt Minion, MD;  Location: [facility];  Service: Orthopedics;  Laterality: Left;   removal skin cancer  2014   from right arm per Dr. Delman Cheadle    Patient Active Problem List   Diagnosis Date Noted   Haglund's deformity of left heel    GERD (gastroesophageal reflux disease) 05/20/2019   Hyperlipidemia 05/04/2012   Essential hypertension 03/05/2009   COUGH 12/27/2007   ALLERGIC RHINITIS 12/03/2007   DEPENDENT EDEMA 11/10/2007   TMJ PAIN 03/04/2007   HEADACHE 03/04/2007    REFERRING DIAG: L-5 Haglund's deformity + achilles tendon repair 10/25/21 WBAT  THERAPY DIAG:  Stiffness of left ankle, not elsewhere classified  Other abnormalities of gait and mobility  Muscle weakness (generalized)  Difficulty walking  Localized edema  Pain in left ankle and joints of left foot  Rationale for  Evaluation and Treatment Rehabilitation  PERTINENT HISTORY: L-5 Haglund's deformity + achilles tendon repair 10/25/21  PRECAUTIONS: Heel lift in shoe, WBAT  SUBJECTIVE: Pt states increased soreness in the front of her shin and top of her foot.   PAIN:  Are you having pain? Yes: NPRS scale: 7/10 Pain location: Top of foot and shin Pain description: Soreness Aggravating factors: Walking Relieving factors: Ice     TODAY'S TREATMENT:  Manual therapy:  STM & TPR ant tib  PROM to stretch ant tib  Kinesiotape to facilitate ant tib  Self care:  Self massage and PROM for ant tib stretching  Skip performing ankle DF with blue tband  Standing:  Side stepping red tband 2x10  Backwards stepping red tband 2x10  Heel lift 2x10  Machine strengthening:  DL leg press 6 plates 2x10  SL leg press 4 plates 2x10  Modalities:  Ice pack x 10 min  PATIENT EDUCATION: Education details: See above Person educated: Patient Education method: Explanation, Verbal cues, and Handouts Education comprehension: verbalized understanding, returned demonstration, and needs further education   HOME EXERCISE PROGRAM: [email or web address]      PT Short Term Goals - 12/12/21 1238       PT SHORT TERM GOAL #1   Title Ind with initial HEP    Baseline -  Time 2    Period Weeks    Status New    Target Date 12/26/21      PT SHORT TERM GOAL #2   Title Pt able to ascend/descend her steps safely in CAM boot with or without AD and one UE assist.    Time 2    Period Weeks    Status New    Target Date 12/26/21              PT Long Term Goals - 01/16/22 0927       PT LONG TERM GOAL #1   Title Improve FOTO to 63 showing functional improvement    Baseline 43 on 01/09/22    Time 6    Period Weeks    Status Revised    Target Date 02/27/22      PT LONG TERM GOAL #2   Title Patient able to ambulate community distances with a normal gait pattern and ascend/descend stairs without difficulty.    Baseline -     Time 6    Period Weeks    Status Revised    Target Date 02/27/22      PT LONG TERM GOAL #3   Title Improve B heel cords flexibility to >= 10 degrees to normalize gait.    Baseline -    Time 6    Period Weeks    Status Revised    Target Date 02/27/22      PT LONG TERM GOAL #4   Title Demonstrate 5/5 R ankle strength    Baseline -    Time 6    Period Weeks    Status Revised    Target Date 02/27/22      PT LONG TERM GOAL #5   Title Trent will be independent with her long-term HEP at DC.    Baseline -    Time 6    Period Weeks    Status Revised    Target Date 02/27/22              Plan - 01/23/22 0911     Clinical Impression Statement Pt presents with s/s of ant tibialis tendonitis. Provided manual work, stretching, trial of ktape and ice to address. Continued to work on LE strengthening.    Examination-Activity Limitations Locomotion Level;Stairs    Stability/Clinical Decision Making Stable/Uncomplicated    Rehab Potential Excellent    PT Frequency 2x / week    PT Duration 6 weeks    PT Treatment/Interventions ADLs/Self Care Home Management;Aquatic Therapy;Cryotherapy;Electrical Stimulation;Iontophoresis '4mg'$ /ml Dexamethasone;Moist Heat;Balance training;Therapeutic exercise;Therapeutic activities;Functional mobility training;Stair training;Gait training;Neuromuscular re-education;Patient/family education;Dry needling;Manual techniques;Taping;Vasopneumatic Device    PT Next Visit Plan Work on ROM/strength per protocol, progress HEP    PT Home Exercise Plan [phone]    Consulted and Agree with Plan of Care Patient               [facility] Beatrix Shipper [place], PT, DPT 01/23/2022, 9:36 AM

## 2022-01-27 ENCOUNTER — Encounter: Payer: Self-pay | Admitting: Physical Therapy

## 2022-01-27 ENCOUNTER — Ambulatory Visit: Payer: BC Managed Care – PPO | Admitting: Physical Therapy

## 2022-01-27 DIAGNOSIS — M6281 Muscle weakness (generalized): Secondary | ICD-10-CM

## 2022-01-27 DIAGNOSIS — R600 Localized edema: Secondary | ICD-10-CM | POA: Diagnosis not present

## 2022-01-27 DIAGNOSIS — R262 Difficulty in walking, not elsewhere classified: Secondary | ICD-10-CM

## 2022-01-27 DIAGNOSIS — M25572 Pain in left ankle and joints of left foot: Secondary | ICD-10-CM

## 2022-01-27 DIAGNOSIS — M25672 Stiffness of left ankle, not elsewhere classified: Secondary | ICD-10-CM | POA: Diagnosis not present

## 2022-01-27 DIAGNOSIS — R2689 Other abnormalities of gait and mobility: Secondary | ICD-10-CM | POA: Diagnosis not present

## 2022-01-27 NOTE — Therapy (Signed)
OUTPATIENT PHYSICAL THERAPY TREATMENT NOTE   Patient Name: Heidi Crawford MRN: [id number] DOB:10/22/1960, 61 y.o., female Today's Date: 01/27/2022  PCP: Maia Petties PROVIDER: Meridee Score   PT End of Session - 01/27/22 0844     Visit Number 14    Date for PT Re-Evaluation 02/27/22    Authorization Type BCBS    PT Start Time 0845    PT Stop Time 0925    PT Time Calculation (min) 40 min    Activity Tolerance Patient tolerated treatment well    Behavior During Therapy WFL for tasks assessed/performed              Past Medical History:  Diagnosis Date   Allergy    seasonal   Dependent edema    GERD (gastroesophageal reflux disease)    Headache(784.0)    Hyperlipemia    Hyperlipidemia    Hypertension    Pneumonia    Squamous cell cancer of skin of forearm    right arm    TMJ pain dysfunction syndrome    Past Surgical History:  Procedure Laterality Date   COLONOSCOPY  09/08/2014   per Dr. Henrene Pastor, clear, repeat in 10 yrs    [place] Left 10/25/2021   Procedure: EXCISION LEFT HAGLUND'S;  Surgeon: Newt Minion, MD;  Location: [place];  Service: Orthopedics;  Laterality: Left;   removal skin cancer  2014   from right arm per Dr. Delman Cheadle    Patient Active Problem List   Diagnosis Date Noted   Haglund's deformity of left heel    GERD (gastroesophageal reflux disease) 05/20/2019   Hyperlipidemia 05/04/2012   Essential hypertension 03/05/2009   COUGH 12/27/2007   ALLERGIC RHINITIS 12/03/2007   DEPENDENT EDEMA 11/10/2007   TMJ PAIN 03/04/2007   HEADACHE 03/04/2007    REFERRING DIAG: L-5 Haglund's deformity + achilles tendon repair 10/25/21 WBAT  THERAPY DIAG:  Stiffness of left ankle, not elsewhere classified  Other abnormalities of gait and mobility  Muscle weakness (generalized)  Difficulty walking  Localized edema  Pain in left ankle and joints of left foot  Rationale for  Evaluation and Treatment Rehabilitation  PERTINENT HISTORY: L-5 Haglund's deformity + achilles tendon repair 10/25/21  PRECAUTIONS: Heel lift in shoe, WBAT  SUBJECTIVE: Reports improved pain at the top of her foot. Was able to go to the grocery store by herself. Pt is to see Dr. Sharol Given tomorrow for f/u.  PAIN:  Are you having pain? No    TODAY'S TREATMENT:  01/27/22   Nu-step L5 x 5 min for warm up   Manual therapy: Grade II to III ankle mobilizations for DF, inv/ev, and metatarsal PA    PROM ankle/foot all directions    Kinesiotape for edema for L lateral ankle    Seated:     Ankle circles 2x10 CW & CCW BAPs level 3: DF/PF 2x10, inv/ev 2x10, circles CW & CCW 2x10 each       Standing: On 6" step:  L foot on step rock forward/back 2x10 Rock side to side 2x10 Side stepping 2x10 with UE support By counter: Side stepping green tband 2x10 Backwards monster walk green tband 2x10    01/23/22 Manual therapy:  STM & TPR ant tib  PROM to stretch ant tib  Kinesiotape to facilitate ant tib  Self care:  Self massage and PROM for ant tib stretching  Skip performing ankle DF with blue tband  Standing:  Side stepping red tband 2x10  Backwards stepping red tband 2x10  Heel lift 2x10  Machine strengthening:  DL leg press 6 plates 2x10  SL leg press 4 plates 2x10  Modalities:  Ice pack x 10 min  PATIENT EDUCATION: Education details: See above Person educated: Patient Education method: Explanation, Verbal cues, and Handouts Education comprehension: verbalized understanding, returned demonstration, and needs further education   HOME EXERCISE PROGRAM: [email or web address]      PT Short Term Goals - 01/27/22 0911       PT SHORT TERM GOAL #1   Title Ind with initial HEP    Baseline -    Time 2    Period Weeks    Status Achieved    Target Date 12/26/21      PT SHORT TERM GOAL #2   Title Pt able to ascend/descend her steps safely in CAM boot with or without AD and one UE  assist.    Time 2    Period Weeks    Status Achieved    Target Date 12/26/21              PT Long Term Goals - 01/16/22 0927       PT LONG TERM GOAL #1   Title Improve FOTO to 63 showing functional improvement    Baseline 43 on 01/09/22    Time 6    Period Weeks    Status Revised    Target Date 02/27/22      PT LONG TERM GOAL #2   Title Patient able to ambulate community distances with a normal gait pattern and ascend/descend stairs without difficulty.    Baseline -    Time 6    Period Weeks    Status Revised    Target Date 02/27/22      PT LONG TERM GOAL #3   Title Improve B heel cords flexibility to >= 10 degrees to normalize gait.    Baseline -    Time 6    Period Weeks    Status Revised    Target Date 02/27/22      PT LONG TERM GOAL #4   Title Demonstrate 5/5 R ankle strength    Baseline -    Time 6    Period Weeks    Status Revised    Target Date 02/27/22      PT LONG TERM GOAL #5   Title Jaclyn will be independent with her long-term HEP at DC.    Baseline -    Time 6    Period Weeks    Status Revised    Target Date 02/27/22              Plan - 01/27/22 0911     Clinical Impression Statement Session focused on improving ankle mobility. Manual work for gentle joint mobilizations -- very stiff talocrural joint noted. PROM for ankle PF/DF, inv/ev. Continued LE strengthening. No s/s of tendonitis this session.    Examination-Activity Limitations Locomotion Level;Stairs    Stability/Clinical Decision Making Stable/Uncomplicated    Rehab Potential Excellent    PT Frequency 2x / week    PT Duration 6 weeks    PT Treatment/Interventions ADLs/Self Care Home Management;Aquatic Therapy;Cryotherapy;Electrical Stimulation;Iontophoresis '4mg'$ /ml Dexamethasone;Moist Heat;Balance training;Therapeutic exercise;Therapeutic activities;Functional mobility training;Stair training;Gait training;Neuromuscular re-education;Patient/family education;Dry needling;Manual  techniques;Taping;Vasopneumatic Device    PT Next Visit Plan Work on ROM/strength per protocol, progress HEP    PT Home Exercise Plan [phone]    Consulted and Agree with Plan of Care  Patient                Manning, PT, DPT 01/27/2022, 9:13 AM

## 2022-01-28 ENCOUNTER — Encounter: Payer: Self-pay | Admitting: Orthopedic Surgery

## 2022-01-28 ENCOUNTER — Ambulatory Visit (INDEPENDENT_AMBULATORY_CARE_PROVIDER_SITE_OTHER): Payer: BC Managed Care – PPO | Admitting: Orthopedic Surgery

## 2022-01-28 DIAGNOSIS — M9260 Juvenile osteochondrosis of tarsus, unspecified ankle: Secondary | ICD-10-CM

## 2022-01-28 NOTE — Progress Notes (Signed)
Office Visit Note   Patient: Heidi Crawford           Date of Birth: 01/16/1961           MRN: [id number] Visit Date: 01/28/2022              Requested by: Laurey Morale, MD [place],  [place] [address] PCP: Laurey Morale, MD  Chief Complaint  Patient presents with   Left Foot - Routine Post Op    10/25/21 left foot excision haglund's deformity       HPI: Patient is a 61 year old woman status post Haglund's resection left foot.  Patient is currently in regular shoewear using a heel lift and a quad cane going to therapy.  Patient is 3 months out from surgery.  Assessment & Plan: Visit Diagnoses:  1. Haglund's deformity     Plan: Discontinue use of the heel lifts continue with therapy and strengthening.  Recommended isometric strengthening.  Follow-Up Instructions: Return if symptoms worsen or fail to improve.   Ortho Exam  Patient is alert, oriented, no adenopathy, well-dressed, normal affect, normal respiratory effort. Examination patient has good range of motion the ankle the incision is well-healed the Haglund's deformity has resolved.  Imaging: No results found. No images are attached to the encounter.  Labs: Lab Results  Component Value Date   HGBA1C 5.4 06/11/2021     Lab Results  Component Value Date   ALBUMIN 4.4 06/11/2021   ALBUMIN 4.6 12/23/2018   ALBUMIN 4.2 11/13/2017    No results found for: "MG" No results found for: "VD25OH"  No results found for: "PREALBUMIN"    Latest Ref Rng & Units 10/25/2021    5:57 AM 06/11/2021   10:51 AM 12/23/2018    9:34 AM  CBC EXTENDED  WBC 4.0 - 10.5 K/uL 5.5  5.5  6.1   RBC 3.87 - 5.11 MIL/uL 4.91  4.76  4.86   Hemoglobin 12.0 - 15.0 g/dL 14.6  14.2  14.6   HCT 36.0 - 46.0 % 44.4  42.2  43.4   Platelets 150 - 400 K/uL 270  258.0  265.0   NEUT# 1.4 - 7.7 K/uL  3.6  4.3   Lymph# 0.7 - 4.0 K/uL  1.4  1.1      There is no height or weight on file to calculate BMI.  Orders:  No orders  of the defined types were placed in this encounter.  No orders of the defined types were placed in this encounter.    Procedures: No procedures performed  Clinical Data: No additional findings.  ROS:  All other systems negative, except as noted in the HPI. Review of Systems  Objective: Vital Signs: There were no vitals taken for this visit.  Specialty Comments:  No specialty comments available.  PMFS History: Patient Active Problem List   Diagnosis Date Noted   Haglund's deformity of left heel    GERD (gastroesophageal reflux disease) 05/20/2019   Hyperlipidemia 05/04/2012   Essential hypertension 03/05/2009   COUGH 12/27/2007   ALLERGIC RHINITIS 12/03/2007   DEPENDENT EDEMA 11/10/2007   TMJ PAIN 03/04/2007   HEADACHE 03/04/2007   Past Medical History:  Diagnosis Date   Allergy    seasonal   Dependent edema    GERD (gastroesophageal reflux disease)    Headache(784.0)    Hyperlipemia    Hyperlipidemia    Hypertension    Pneumonia    Squamous cell cancer of skin of forearm  right arm    TMJ pain dysfunction syndrome     Family History  Problem Relation Age of Onset   Alcohol abuse Unknown        fhx   Arthritis Unknown        fhx   Hyperlipidemia Unknown        fhx   Hypertension Unknown        fhx   Heart disease Unknown        fhx   Stroke Unknown        fhx   Hypertension Mother    Hypertension Father    Colon cancer Neg Hx    Rectal cancer Neg Hx    Stomach cancer Neg Hx     Past Surgical History:  Procedure Laterality Date   COLONOSCOPY  09/08/2014   per Dr. Henrene Pastor, clear, repeat in 10 yrs    DENTAL SURGERY     EXCISION HAGLUND'S DEFORMITY WITH ACHILLES TENDON REPAIR Left 10/25/2021   Procedure: EXCISION LEFT HAGLUND'S;  Surgeon: Newt Minion, MD;  Location: [place];  Service: Orthopedics;  Laterality: Left;   removal skin cancer  2014   from right arm per Dr. Delman Cheadle    Social History   Occupational History   Not on file  Tobacco  Use   Smoking status: Former    Types: Cigarettes    Quit date: 07/07/2000    Years since quitting: 21.5   Smokeless tobacco: Never  Vaping Use   Vaping Use: Never used  Substance and Sexual Activity   Alcohol use: No    Alcohol/week: 0.0 standard drinks of alcohol   Drug use: No   Sexual activity: Not on file

## 2022-01-30 ENCOUNTER — Ambulatory Visit: Payer: BC Managed Care – PPO | Admitting: Physical Therapy

## 2022-01-30 ENCOUNTER — Encounter: Payer: Self-pay | Admitting: Physical Therapy

## 2022-01-30 DIAGNOSIS — R2689 Other abnormalities of gait and mobility: Secondary | ICD-10-CM

## 2022-01-30 DIAGNOSIS — M25572 Pain in left ankle and joints of left foot: Secondary | ICD-10-CM

## 2022-01-30 DIAGNOSIS — M6281 Muscle weakness (generalized): Secondary | ICD-10-CM

## 2022-01-30 DIAGNOSIS — R600 Localized edema: Secondary | ICD-10-CM

## 2022-01-30 DIAGNOSIS — R262 Difficulty in walking, not elsewhere classified: Secondary | ICD-10-CM | POA: Diagnosis not present

## 2022-01-30 DIAGNOSIS — M25672 Stiffness of left ankle, not elsewhere classified: Secondary | ICD-10-CM

## 2022-01-30 NOTE — Therapy (Signed)
OUTPATIENT PHYSICAL THERAPY TREATMENT NOTE   Patient Name: Heidi Crawford MRN: [id number] DOB:[birth year], 61 y.o., female Today's Date: 01/30/2022  PCP: Maia Petties PROVIDER: Meridee Score   PT End of Session - 01/30/22 0846     Visit Number 15    Date for PT Re-Evaluation 02/27/22    Authorization Type BCBS    PT Start Time 0846    PT Stop Time 0925    PT Time Calculation (min) 39 min    Activity Tolerance Patient tolerated treatment well    Behavior During Therapy [facility] for tasks assessed/performed               Past Medical History:  Diagnosis Date   Allergy    seasonal   Dependent edema    GERD (gastroesophageal reflux disease)    Headache(784.0)    Hyperlipemia    Hyperlipidemia    Hypertension    Pneumonia    Squamous cell cancer of skin of forearm    right arm    TMJ pain dysfunction syndrome    Past Surgical History:  Procedure Laterality Date   COLONOSCOPY  09/08/2014   per Dr. Henrene Pastor, clear, repeat in 10 yrs    [place] Left 10/25/2021   Procedure: EXCISION LEFT HAGLUND'S;  Surgeon: Newt Minion, MD;  Location: [place];  Service: Orthopedics;  Laterality: Left;   removal skin cancer  2014   from right arm per Dr. Delman Cheadle    Patient Active Problem List   Diagnosis Date Noted   Haglund's deformity of left heel    GERD (gastroesophageal reflux disease) 05/20/2019   Hyperlipidemia 05/04/2012   Essential hypertension 03/05/2009   COUGH 12/27/2007   ALLERGIC RHINITIS 12/03/2007   DEPENDENT EDEMA 11/10/2007   TMJ PAIN 03/04/2007   HEADACHE 03/04/2007    REFERRING DIAG: L-5 Haglund's deformity + achilles tendon repair 10/25/21 WBAT  THERAPY DIAG:  Stiffness of left ankle, not elsewhere classified  Other abnormalities of gait and mobility  Muscle weakness (generalized)  Difficulty walking  Localized edema  Pain in left ankle and joints of left foot  Rationale for  Evaluation and Treatment Rehabilitation  PERTINENT HISTORY: L-5 Haglund's deformity + achilles tendon repair 10/25/21  PRECAUTIONS: None  SUBJECTIVE: Pt states her foot feels really stiff now without the heel lift. Pt states she went down 5 steps at the house and reports it felt stiff but didn't hurt.   PAIN:  Are you having pain? No    TODAY'S TREATMENT:   01/30/22   Nu-step L5 x 5 min for warm up   Manual therapy: Grade II to III ankle mobilizations for DF, inv/ev, and metatarsal PA    PROM ankle/foot all directions    Seated:     Ankle circles 2x10 CW & CCW    ABC's       Standing: Gastroc stretch against wall 2x30 sec Soleus stretch against wall 2x30 sec At 4" step: L foot on step, rock forward/backward weight shift 2x10 L foot forward step up 2x10  Gait:  SBA SPC cane 3 laps around gym -- cues for heel strike/ankle DF  Forwards and then backwards walking    01/27/22   Nu-step L5 x 5 min for warm up   Manual therapy: Grade II to III ankle mobilizations for DF, inv/ev, and metatarsal PA    PROM ankle/foot all directions    Kinesiotape for edema for L lateral ankle  Seated:     Ankle circles 2x10 CW & CCW BAPs level 3: DF/PF 2x10, inv/ev 2x10, circles CW & CCW 2x10 each       Standing: On 6" step:  L foot on step rock forward/back 2x10 Rock side to side 2x10 Side stepping 2x10 with UE support By counter: Side stepping green tband 2x10 Backwards monster walk green tband 2x10      PATIENT EDUCATION: Education details: See above Person educated: Patient Education method: Explanation, Verbal cues, and Handouts Education comprehension: verbalized understanding, returned demonstration, and needs further education   HOME EXERCISE PROGRAM: [email or web address]      PT Short Term Goals - 01/27/22 0911       PT SHORT TERM GOAL #1   Title Ind with initial HEP    Baseline -    Time 2    Period Weeks    Status Achieved    Target Date 12/26/21      PT SHORT  TERM GOAL #2   Title Pt able to ascend/descend her steps safely in CAM boot with or without AD and one UE assist.    Time 2    Period Weeks    Status Achieved    Target Date 12/26/21              PT Long Term Goals - 01/16/22 0927       PT LONG TERM GOAL #1   Title Improve FOTO to 63 showing functional improvement    Baseline 43 on 01/09/22    Time 6    Period Weeks    Status Revised    Target Date 02/27/22      PT LONG TERM GOAL #2   Title Patient able to ambulate community distances with a normal gait pattern and ascend/descend stairs without difficulty.    Baseline -    Time 6    Period Weeks    Status Revised    Target Date 02/27/22      PT LONG TERM GOAL #3   Title Improve B heel cords flexibility to >= 10 degrees to normalize gait.    Baseline -    Time 6    Period Weeks    Status Revised    Target Date 02/27/22      PT LONG TERM GOAL #4   Title Demonstrate 5/5 R ankle strength    Baseline -    Time 6    Period Weeks    Status Revised    Target Date 02/27/22      PT LONG TERM GOAL #5   Title Lanissa will be independent with her long-term HEP at DC.    Baseline -    Time 6    Period Weeks    Status Revised    Target Date 02/27/22              Plan    Clinical Impression Statement Able to initiate increased dorsiflexion ROM this session. Pt is now cleared from using heel lift. Continued manual to work through L ankle hypomobility. Pt with difficulty performing full range ankle circles.    Examination-Activity Limitations Locomotion Level;Stairs    Stability/Clinical Decision Making Stable/Uncomplicated    Rehab Potential Excellent    PT Frequency 2x / week    PT Duration 6 weeks    PT Treatment/Interventions ADLs/Self Care Home Management;Aquatic Therapy;Cryotherapy;Electrical Stimulation;Iontophoresis '4mg'$ /ml Dexamethasone;Moist Heat;Balance training;Therapeutic exercise;Therapeutic activities;Functional mobility training;Stair training;Gait  training;Neuromuscular re-education;Patient/family education;Dry needling;Manual techniques;Taping;Vasopneumatic Device    PT Next Visit  Plan Work on [employer] per protocol, progress HEP    PT Home Exercise Plan [phone]    Consulted and Agree with Plan of Care Patient        [facility] Beatrix Shipper [place], PT, DPT 01/30/2022, 8:47 AM

## 2022-02-03 ENCOUNTER — Ambulatory Visit: Payer: BC Managed Care – PPO | Admitting: Physical Therapy

## 2022-02-06 ENCOUNTER — Encounter: Payer: Self-pay | Admitting: Physical Therapy

## 2022-02-06 ENCOUNTER — Ambulatory Visit: Payer: BC Managed Care – PPO | Attending: Orthopedic Surgery | Admitting: Physical Therapy

## 2022-02-06 DIAGNOSIS — R600 Localized edema: Secondary | ICD-10-CM | POA: Insufficient documentation

## 2022-02-06 DIAGNOSIS — R2689 Other abnormalities of gait and mobility: Secondary | ICD-10-CM | POA: Insufficient documentation

## 2022-02-06 DIAGNOSIS — R262 Difficulty in walking, not elsewhere classified: Secondary | ICD-10-CM | POA: Insufficient documentation

## 2022-02-06 DIAGNOSIS — M25672 Stiffness of left ankle, not elsewhere classified: Secondary | ICD-10-CM | POA: Insufficient documentation

## 2022-02-06 DIAGNOSIS — M6281 Muscle weakness (generalized): Secondary | ICD-10-CM | POA: Diagnosis not present

## 2022-02-06 DIAGNOSIS — M25572 Pain in left ankle and joints of left foot: Secondary | ICD-10-CM | POA: Diagnosis not present

## 2022-02-06 NOTE — Therapy (Signed)
OUTPATIENT PHYSICAL THERAPY TREATMENT NOTE   Patient Name: Heidi Crawford MRN: [id number] DOB:Oct 30, 1960, 61 y.o., female Today's Date: 02/06/2022  PCP: Maia Petties PROVIDER: Meridee Score   PT End of Session - 02/06/22 0853     Visit Number 16    Date for PT Re-Evaluation 02/27/22    Authorization Type BCBS    PT Start Time 0848    PT Stop Time 0930    PT Time Calculation (min) 42 min    Activity Tolerance Patient tolerated treatment well    Behavior During Therapy WFL for tasks assessed/performed               Past Medical History:  Diagnosis Date   Allergy    seasonal   Dependent edema    GERD (gastroesophageal reflux disease)    Headache(784.0)    Hyperlipemia    Hyperlipidemia    Hypertension    Pneumonia    Squamous cell cancer of skin of forearm    right arm    TMJ pain dysfunction syndrome    Past Surgical History:  Procedure Laterality Date   COLONOSCOPY  09/08/2014   per Dr. Henrene Pastor, clear, repeat in 10 yrs    [place] Left 10/25/2021   Procedure: EXCISION LEFT HAGLUND'S;  Surgeon: Newt Minion, MD;  Location: [place];  Service: Orthopedics;  Laterality: Left;   removal skin cancer  2014   from right arm per Dr. Delman Cheadle    Patient Active Problem List   Diagnosis Date Noted   Haglund's deformity of left heel    GERD (gastroesophageal reflux disease) 05/20/2019   Hyperlipidemia 05/04/2012   Essential hypertension 03/05/2009   COUGH 12/27/2007   ALLERGIC RHINITIS 12/03/2007   DEPENDENT EDEMA 11/10/2007   TMJ PAIN 03/04/2007   HEADACHE 03/04/2007    REFERRING DIAG: L-5 Haglund's deformity + achilles tendon repair 10/25/21 WBAT  THERAPY DIAG:  Stiffness of left ankle, not elsewhere classified  Other abnormalities of gait and mobility  Muscle weakness (generalized)  Difficulty walking  Localized edema  Pain in left ankle and joints of left foot  Rationale for  Evaluation and Treatment Rehabilitation  PERTINENT HISTORY: L-5 Haglund's deformity + achilles tendon repair 10/25/21  PRECAUTIONS: None  SUBJECTIVE: Pt reports continued stiffness and swollen.  PAIN:  Are you having pain? No    TODAY'S TREATMENT:   02/06/22   Nu-step L5 x 5 min for warm up   Manual therapy: Grade II to III ankle mobilizations for DF, inv/ev, and metatarsal PA    PROM ankle/foot all directions   Supine:    Gastroc stretch with towel 2x30 sec   Seated:    Ankle DF AROM x10    Ankle inv/ev AROM x10    Ankle circles x10 CW & CCW   Standing:    SLS on L x30 sec with small knee bends bilat UE support    Bilat heel raise 2x10    Mini squat 2x10    On 6" step L foot up, forward flexion 2x10    At counter L LE weight bearing, practicing swing through pattern on R LE      01/30/22   Nu-step L5 x 5 min for warm up   Manual therapy: Grade II to III ankle mobilizations for DF, inv/ev, and metatarsal PA    PROM ankle/foot all directions    Seated:     Ankle circles 2x10 [place]  ABC's       Standing: Gastroc stretch against wall 2x30 sec Soleus stretch against wall 2x30 sec At 4" step: L foot on step, rock forward/backward weight shift 2x10 L foot forward step up 2x10  Gait:  SBA SPC cane 3 laps around gym -- cues for heel strike/ankle DF  Forwards and then backwards walking       PATIENT EDUCATION: Education details: See above Person educated: Patient Education method: Explanation, Verbal cues, and Handouts Education comprehension: verbalized understanding, returned demonstration, and needs further education   HOME EXERCISE PROGRAM: [email or web address]      PT Short Term Goals - 01/27/22 0911       PT SHORT TERM GOAL #1   Title Ind with initial HEP    Baseline -    Time 2    Period Weeks    Status Achieved    Target Date 12/26/21      PT SHORT TERM GOAL #2   Title Pt able to ascend/descend her steps safely in CAM boot with or without AD and  one UE assist.    Time 2    Period Weeks    Status Achieved    Target Date 12/26/21              PT Long Term Goals - 01/16/22 0927       PT LONG TERM GOAL #1   Title Improve FOTO to 63 showing functional improvement    Baseline 43 on 01/09/22    Time 6    Period Weeks    Status Revised    Target Date 02/27/22      PT LONG TERM GOAL #2   Title Patient able to ambulate community distances with a normal gait pattern and ascend/descend stairs without difficulty.    Baseline -    Time 6    Period Weeks    Status Revised    Target Date 02/27/22      PT LONG TERM GOAL #3   Title Improve B heel cords flexibility to >= 10 degrees to normalize gait.    Baseline -    Time 6    Period Weeks    Status Revised    Target Date 02/27/22      PT LONG TERM GOAL #4   Title Demonstrate 5/5 R ankle strength    Baseline -    Time 6    Period Weeks    Status Revised    Target Date 02/27/22      PT LONG TERM GOAL #5   Title Lether will be independent with her long-term HEP at DC.    Baseline -    Time 6    Period Weeks    Status Revised    Target Date 02/27/22              Plan    Clinical Impression Statement Continued to work on improving ankle mobility and DF. Hindfoot remains hypomobile requiring manual work. Pt continues to have edema -- discussed compression sleeve to assist with this. Continued single leg weight shifting and stability.    Examination-Activity Limitations Locomotion Level;Stairs    Stability/Clinical Decision Making Stable/Uncomplicated    Rehab Potential Excellent    PT Frequency 2x / week    PT Duration 6 weeks    PT Treatment/Interventions ADLs/Self Care Home Management;Aquatic Therapy;Cryotherapy;Electrical Stimulation;Iontophoresis '4mg'$ /ml Dexamethasone;Moist Heat;Balance training;Therapeutic exercise;Therapeutic activities;Functional mobility training;Stair training;Gait training;Neuromuscular re-education;Patient/family education;Dry  needling;Manual techniques;Taping;Vasopneumatic Device    PT Next Visit Plan Work  on ROM/strength per protocol, progress HEP    PT Home Exercise Plan [phone]    Consulted and Agree with Plan of Care Patient        [facility] Beatrix Shipper [place], PT, DPT 02/06/2022, 8:54 AM

## 2022-02-10 ENCOUNTER — Ambulatory Visit: Payer: BC Managed Care – PPO | Admitting: Physical Therapy

## 2022-02-10 ENCOUNTER — Encounter: Payer: Self-pay | Admitting: Physical Therapy

## 2022-02-10 DIAGNOSIS — M25672 Stiffness of left ankle, not elsewhere classified: Secondary | ICD-10-CM | POA: Diagnosis not present

## 2022-02-10 DIAGNOSIS — M6281 Muscle weakness (generalized): Secondary | ICD-10-CM | POA: Diagnosis not present

## 2022-02-10 DIAGNOSIS — R262 Difficulty in walking, not elsewhere classified: Secondary | ICD-10-CM

## 2022-02-10 DIAGNOSIS — R600 Localized edema: Secondary | ICD-10-CM

## 2022-02-10 DIAGNOSIS — R2689 Other abnormalities of gait and mobility: Secondary | ICD-10-CM

## 2022-02-10 DIAGNOSIS — M25572 Pain in left ankle and joints of left foot: Secondary | ICD-10-CM

## 2022-02-10 NOTE — Therapy (Signed)
OUTPATIENT PHYSICAL THERAPY TREATMENT NOTE   Patient Name: Heidi Crawford MRN: [id number] DOB:1961-02-25, 61 y.o., female Today's Date: 02/10/2022  PCP: Maia Petties PROVIDER: Meridee Score   PT End of Session - 02/10/22 0845     Visit Number 52    Date for PT Re-Evaluation 02/27/22    Authorization Type BCBS    PT Start Time 0845    PT Stop Time 0925    PT Time Calculation (min) 40 min    Activity Tolerance Patient tolerated treatment well    Behavior During Therapy WFL for tasks assessed/performed               Past Medical History:  Diagnosis Date   Allergy    seasonal   Dependent edema    GERD (gastroesophageal reflux disease)    Headache(784.0)    Hyperlipemia    Hyperlipidemia    Hypertension    Pneumonia    Squamous cell cancer of skin of forearm    right arm    TMJ pain dysfunction syndrome    Past Surgical History:  Procedure Laterality Date   COLONOSCOPY  09/08/2014   per Dr. Henrene Pastor, clear, repeat in 10 yrs    [place] Left 10/25/2021   Procedure: EXCISION LEFT HAGLUND'S;  Surgeon: Newt Minion, MD;  Location: [place];  Service: Orthopedics;  Laterality: Left;   removal skin cancer  2014   from right arm per Dr. Delman Cheadle    Patient Active Problem List   Diagnosis Date Noted   Haglund's deformity of left heel    GERD (gastroesophageal reflux disease) 05/20/2019   Hyperlipidemia 05/04/2012   Essential hypertension 03/05/2009   COUGH 12/27/2007   ALLERGIC RHINITIS 12/03/2007   DEPENDENT EDEMA 11/10/2007   TMJ PAIN 03/04/2007   HEADACHE 03/04/2007    REFERRING DIAG: L-5 Haglund's deformity + achilles tendon repair 10/25/21 [facility]  THERAPY DIAG:  Stiffness of left ankle, not elsewhere classified  Other abnormalities of gait and mobility  Muscle weakness (generalized)  Difficulty walking  Localized edema  Pain in left ankle and joints of left foot  Rationale for  Evaluation and Treatment Rehabilitation  PERTINENT HISTORY: L-5 Haglund's deformity + achilles tendon repair 10/25/21  PRECAUTIONS: None  SUBJECTIVE: Pt states she got the compression sock. Has been helping. Has been doing her exercises.   PAIN:  Are you having pain? No    TODAY'S TREATMENT:   02/10/22   Nu-step L5 x 5 min for warm up   Manual therapy: Grade II to III ankle mobilizations for DF, inv/ev, and metatarsal PA    PROM ankle/foot all directions   Standing:    Gastroc stretch heel off steps 2x30 sec    Soleus stretch heel off steps 2x30 sec On 6" step L foot up, forward flexion 2x30 sec Eccentric heel raise x5 DL heel/toe raise 2x10 Heel raise toes in x10, toes out x10    At counter L LE weight bearing, practicing swing through pattern on R LE x10    R foot tap to cone 2x10    L2 on BAPS CW & CCW x10    02/06/22   Nu-step L5 x 5 min for warm up   Manual therapy: Grade II to III ankle mobilizations for DF, inv/ev, and metatarsal PA    PROM ankle/foot all directions   Supine:    Gastroc stretch with towel 2x30 sec   Seated:  Ankle DF AROM x10    Ankle inv/ev AROM x10    Ankle circles x10 CW & CCW   Standing:    SLS on L x30 sec with small knee bends bilat UE support    Bilat heel raise 2x10    Mini squat 2x10    On 6" step L foot up, forward flexion 2x10    At counter L LE weight bearing, practicing swing through pattern on R LE      PATIENT EDUCATION: Education details: See above Person educated: Patient Education method: Explanation, Verbal cues, and Handouts Education comprehension: verbalized understanding, returned demonstration, and needs further education   HOME EXERCISE PROGRAM: [email or web address]      PT Short Term Goals - 01/27/22 0911       PT SHORT TERM GOAL #1   Title Ind with initial HEP    Baseline -    Time 2    Period Weeks    Status Achieved    Target Date 12/26/21      PT SHORT TERM GOAL #2   Title Pt able to ascend/descend her  steps safely in CAM boot with or without AD and one UE assist.    Time 2    Period Weeks    Status Achieved    Target Date 12/26/21              PT Long Term Goals - 01/16/22 0927       PT LONG TERM GOAL #1   Title Improve FOTO to 63 showing functional improvement    Baseline 43 on 01/09/22    Time 6    Period Weeks    Status Revised    Target Date 02/27/22      PT LONG TERM GOAL #2   Title Patient able to ambulate community distances with a normal gait pattern and ascend/descend stairs without difficulty.    Baseline -    Time 6    Period Weeks    Status Revised    Target Date 02/27/22      PT LONG TERM GOAL #3   Title Improve B heel cords flexibility to >= 10 degrees to normalize gait.    Baseline -    Time 6    Period Weeks    Status Revised    Target Date 02/27/22      PT LONG TERM GOAL #4   Title Demonstrate 5/5 R ankle strength    Baseline -    Time 6    Period Weeks    Status Revised    Target Date 02/27/22      PT LONG TERM GOAL #5   Title Delcia will be independent with her long-term HEP at DC.    Baseline -    Time 6    Period Weeks    Status Revised    Target Date 02/27/22              Plan    Clinical Impression Statement Pt with improving hind foot mobility. Continued to work on increasing all exercises into standing. Continued to work on single leg stability.   Examination-Activity Limitations Locomotion Level;Stairs    Stability/Clinical Decision Making Stable/Uncomplicated    Rehab Potential Excellent    PT Frequency 2x / week    PT Duration 6 weeks    PT Treatment/Interventions ADLs/Self Care Home Management;Aquatic Therapy;Cryotherapy;Electrical Stimulation;Iontophoresis '4mg'$ /ml Dexamethasone;Moist Heat;Balance training;Therapeutic exercise;Therapeutic activities;Functional mobility training;Stair training;Gait training;Neuromuscular re-education;Patient/family education;Dry needling;Manual techniques;Taping;Vasopneumatic Device     PT  Next Visit Plan Work on ROM/strength per protocol, progress HEP    PT Home Exercise Plan [phone]    Consulted and Agree with Plan of Care Patient        [facility] Beatrix Shipper [place], PT, DPT 02/10/2022, 8:45 AM

## 2022-02-13 ENCOUNTER — Ambulatory Visit: Payer: BC Managed Care – PPO | Admitting: Physical Therapy

## 2022-02-13 ENCOUNTER — Encounter: Payer: Self-pay | Admitting: Physical Therapy

## 2022-02-13 DIAGNOSIS — M25672 Stiffness of left ankle, not elsewhere classified: Secondary | ICD-10-CM | POA: Diagnosis not present

## 2022-02-13 DIAGNOSIS — M6281 Muscle weakness (generalized): Secondary | ICD-10-CM

## 2022-02-13 DIAGNOSIS — R2689 Other abnormalities of gait and mobility: Secondary | ICD-10-CM

## 2022-02-13 DIAGNOSIS — M25572 Pain in left ankle and joints of left foot: Secondary | ICD-10-CM | POA: Diagnosis not present

## 2022-02-13 DIAGNOSIS — R600 Localized edema: Secondary | ICD-10-CM | POA: Diagnosis not present

## 2022-02-13 DIAGNOSIS — R262 Difficulty in walking, not elsewhere classified: Secondary | ICD-10-CM

## 2022-02-13 NOTE — Therapy (Signed)
OUTPATIENT PHYSICAL THERAPY TREATMENT NOTE   Patient Name: Heidi Crawford MRN: [id number] DOB:1961/01/18, 61 y.o., female Today's Date: 02/13/2022  PCP: Maia Petties PROVIDER: Meridee Score   PT End of Session - 02/13/22 0844     Visit Number 18    Date for PT Re-Evaluation 02/27/22    Authorization Type BCBS    PT Start Time 0844    PT Stop Time 0925    PT Time Calculation (min) 41 min    Activity Tolerance Patient tolerated treatment well    Behavior During Therapy [facility] for tasks assessed/performed               Past Medical History:  Diagnosis Date   Allergy    seasonal   Dependent edema    GERD (gastroesophageal reflux disease)    Headache(784.0)    Hyperlipemia    Hyperlipidemia    Hypertension    Pneumonia    Squamous cell cancer of skin of forearm    right arm    TMJ pain dysfunction syndrome    Past Surgical History:  Procedure Laterality Date   COLONOSCOPY  09/08/2014   per Dr. Henrene Pastor, clear, repeat in 10 yrs    [place] Left 10/25/2021   Procedure: EXCISION LEFT HAGLUND'S;  Surgeon: Newt Minion, MD;  Location: [place];  Service: Orthopedics;  Laterality: Left;   removal skin cancer  2014   from right arm per Dr. Delman Cheadle    Patient Active Problem List   Diagnosis Date Noted   Haglund's deformity of left heel    GERD (gastroesophageal reflux disease) 05/20/2019   Hyperlipidemia 05/04/2012   Essential hypertension 03/05/2009   COUGH 12/27/2007   ALLERGIC RHINITIS 12/03/2007   DEPENDENT EDEMA 11/10/2007   TMJ PAIN 03/04/2007   HEADACHE 03/04/2007    REFERRING DIAG: L-5 Haglund's deformity + achilles tendon repair 10/25/21 [facility]  THERAPY DIAG:  Stiffness of left ankle, not elsewhere classified  Other abnormalities of gait and mobility  Muscle weakness (generalized)  Difficulty walking  Localized edema  Pain in left ankle and joints of left foot  Rationale for  Evaluation and Treatment Rehabilitation  PERTINENT HISTORY: L-5 Haglund's deformity + achilles tendon repair 10/25/21  PRECAUTIONS: None  SUBJECTIVE: Pt states she got the compression sock. Has been helping. Has been doing her exercises.   PAIN:  Are you having pain? No    TODAY'S TREATMENT:  02/13/22  Manual therapy:  Grade II to III ankle mobilizations for DF, inv/ev (hindfoot)  PROM ankle/foot in all directions Self care:  Stretching peroneals 2x30 sec  Ankle circle PROM CW&CCW x10  Self ankle mobilizations for hind foot inv/ev Seated:  Ankle circle AROM x5 Standing on airex:  Heel raise 2x10  Big Toe extension 2x10  Toe raises 2x10  Feet together EO x30 sec  Partial tandem EO 2x30 sec  Standing on rockerboard:   A/P 2x10   Lateral 2x10      02/10/22   Nu-step L5 x 5 min for warm up   Manual therapy: Grade II to III ankle mobilizations for DF, inv/ev, and metatarsal PA    PROM ankle/foot all directions   Standing:    Gastroc stretch heel off steps 2x30 sec    Soleus stretch heel off steps 2x30 sec On 6" step L foot up, forward flexion 2x30 sec Eccentric heel raise x5 DL heel/toe raise 2x10 Heel raise toes in x10, toes out  x10    At counter L LE weight bearing, practicing swing through pattern on R LE x10    R foot tap to cone 2x10    L2 on BAPS CW & CCW x10      PATIENT EDUCATION: Education details: See above Person educated: Patient Education method: Explanation, Verbal cues, and Handouts Education comprehension: verbalized understanding, returned demonstration, and needs further education   HOME EXERCISE PROGRAM: [email or web address]      PT Short Term Goals - 01/27/22 0911       PT SHORT TERM GOAL #1   Title Ind with initial HEP    Baseline -    Time 2    Period Weeks    Status Achieved    Target Date 12/26/21      PT SHORT TERM GOAL #2   Title Pt able to ascend/descend her steps safely in CAM boot with or without AD and one UE assist.    Time 2     Period Weeks    Status Achieved    Target Date 12/26/21              PT Long Term Goals - 01/16/22 0927       PT LONG TERM GOAL #1   Title Improve FOTO to 63 showing functional improvement    Baseline 43 on 01/09/22    Time 6    Period Weeks    Status Revised    Target Date 02/27/22      PT LONG TERM GOAL #2   Title Patient able to ambulate community distances with a normal gait pattern and ascend/descend stairs without difficulty.    Baseline -    Time 6    Period Weeks    Status Revised    Target Date 02/27/22      PT LONG TERM GOAL #3   Title Improve B heel cords flexibility to >= 10 degrees to normalize gait.    Baseline -    Time 6    Period Weeks    Status Revised    Target Date 02/27/22      PT LONG TERM GOAL #4   Title Demonstrate 5/5 R ankle strength    Baseline -    Time 6    Period Weeks    Status Revised    Target Date 02/27/22      PT LONG TERM GOAL #5   Title Dayonna will be independent with her long-term HEP at DC.    Baseline -    Time 6    Period Weeks    Status Revised    Target Date 02/27/22              Plan    Clinical Impression Statement Pt with continued improvement in ankle hypomobility. Pt getting slight pain along peroneals and mild edema between big toe and 2nd phalanges on L foot. Initiated rockerboard exercises and foam exercises for foot and ankle mobility.    Examination-Activity Limitations Locomotion Level;Stairs    Stability/Clinical Decision Making Stable/Uncomplicated    Rehab Potential Excellent    PT Frequency 2x / week    PT Duration 6 weeks    PT Treatment/Interventions ADLs/Self Care Home Management;Aquatic Therapy;Cryotherapy;Electrical Stimulation;Iontophoresis '4mg'$ /ml Dexamethasone;Moist Heat;Balance training;Therapeutic exercise;Therapeutic activities;Functional mobility training;Stair training;Gait training;Neuromuscular re-education;Patient/family education;Dry needling;Manual techniques;Taping;Vasopneumatic  Device    PT Next Visit Plan Work on ROM/strength per protocol, progress HEP    PT Home Exercise Plan [phone]    Consulted and Agree with Plan of  Care Patient        [place], PT, DPT 02/13/2022, 9:25 AM

## 2022-02-18 ENCOUNTER — Encounter: Payer: Self-pay | Admitting: Physical Therapy

## 2022-02-18 ENCOUNTER — Ambulatory Visit: Payer: BC Managed Care – PPO | Admitting: Physical Therapy

## 2022-02-18 DIAGNOSIS — M6281 Muscle weakness (generalized): Secondary | ICD-10-CM

## 2022-02-18 DIAGNOSIS — R2689 Other abnormalities of gait and mobility: Secondary | ICD-10-CM

## 2022-02-18 DIAGNOSIS — M25572 Pain in left ankle and joints of left foot: Secondary | ICD-10-CM | POA: Diagnosis not present

## 2022-02-18 DIAGNOSIS — R262 Difficulty in walking, not elsewhere classified: Secondary | ICD-10-CM | POA: Diagnosis not present

## 2022-02-18 DIAGNOSIS — R600 Localized edema: Secondary | ICD-10-CM | POA: Diagnosis not present

## 2022-02-18 DIAGNOSIS — M25672 Stiffness of left ankle, not elsewhere classified: Secondary | ICD-10-CM | POA: Diagnosis not present

## 2022-02-18 NOTE — Therapy (Signed)
OUTPATIENT PHYSICAL THERAPY TREATMENT NOTE   Patient Name: Heidi Crawford MRN: [id number] DOB:[birth year], 61 y.o., female Today's Date: 02/18/2022  PCP: Heidi Crawford PROVIDER: Meridee Crawford   PT End of Session - 02/18/22 1015     Visit Number 19    Date for PT Re-Evaluation 02/27/22    Authorization Type BCBS    PT Start Time 1015    PT Stop Time 1100    PT Time Calculation (min) 45 min    Activity Tolerance Patient tolerated treatment well    Behavior During Therapy WFL for tasks assessed/performed               Past Medical History:  Diagnosis Date   Allergy    seasonal   Dependent edema    GERD (gastroesophageal reflux disease)    Headache(784.0)    Hyperlipemia    Hyperlipidemia    Hypertension    Pneumonia    Squamous cell cancer of skin of forearm    right arm    TMJ pain dysfunction syndrome    Past Surgical History:  Procedure Laterality Date   COLONOSCOPY  09/08/2014   per Dr. Henrene Crawford, clear, repeat in 10 yrs    [place] Left 10/25/2021   Procedure: EXCISION LEFT HAGLUND'S;  Surgeon: Heidi Minion, MD;  Location: [place];  Service: Orthopedics;  Laterality: Left;   removal skin cancer  2014   from right arm per Dr. Delman Crawford    Patient Active Problem List   Diagnosis Date Noted   Haglund's deformity of left heel    GERD (gastroesophageal reflux disease) 05/20/2019   Hyperlipidemia 05/04/2012   Essential hypertension 03/05/2009   COUGH 12/27/2007   ALLERGIC RHINITIS 12/03/2007   DEPENDENT EDEMA 11/10/2007   TMJ PAIN 03/04/2007   HEADACHE 03/04/2007    REFERRING DIAG: L-5 Haglund's deformity + achilles tendon repair 10/25/21 [facility]  THERAPY DIAG:  Stiffness of left ankle, not elsewhere classified  Other abnormalities of gait and mobility  Muscle weakness (generalized)  Difficulty walking  Localized edema  Pain in left ankle and joints of left foot  Rationale for  Evaluation and Treatment Rehabilitation  PERTINENT HISTORY: L-5 Haglund's deformity + achilles tendon repair 10/25/21  PRECAUTIONS: None  SUBJECTIVE: Pt reports she's been able to walk without the cane this weekend.    PAIN:  Are you having pain? No    TODAY'S TREATMENT:  02/18/22  L1; 3 min fwd, 3 min bwd on recumbent bike  Manual therapy: Grade II to III ankle mobilizations for DF, inv/ev (hindfoot) PROM ankle/foot in all directions Seated:  Ankle circle AROM 2x10 CW & CCW Standing on rockerboard:  A/P 2x10  Lateral 2x10 Standing on blue side of bosu:  Inv 2x10  Ev 2x10 Standing on airex:  L step ups 2x10  L SLS 2x20 sec with UE support  Tandem stance L foot back EO 2x30 sec  Mini squat 2x10  Heel raise 2x10 toes in and then toes out   02/13/22  Manual therapy:  Grade II to III ankle mobilizations for DF, inv/ev (hindfoot)  PROM ankle/foot in all directions Self care:  Stretching peroneals 2x30 sec  Ankle circle PROM CW&CCW x10  Self ankle mobilizations for hind foot inv/ev Seated:  Ankle circle AROM x5 Standing on airex:  Heel raise 2x10  Big Toe extension 2x10  Toe raises 2x10  Feet together EO x30 sec  Partial tandem EO 2x30 sec  Standing on rockerboard:   A/P 2x10   Lateral 2x10      PATIENT EDUCATION: Education details: See above Person educated: Patient Education method: Explanation, Verbal cues, and Handouts Education comprehension: verbalized understanding, returned demonstration, and needs further education   HOME EXERCISE PROGRAM: Access Code: [email or web address] URL: [email or web address] Date: 02/18/2022 Prepared by: Heidi Crawford  Exercises - Single Leg Balance with Clock Reach  - 1 x daily - 7 x weekly - 2 sets - 10 reps - Seated Ankle Alphabet  - 1 x daily - 7 x weekly - 1 sets - Gastroc Stretch on Wall  - 1 x daily - 7 x weekly - 2 sets - 30 sec hold - Soleus Stretch on Wall  - 1 x daily - 7 x weekly - 2 sets -  30 sec hold - Heel Toe Raises with Counter Support  - 1 x daily - 7 x weekly - 1 sets - 10 reps - Standing Heel Raise with Toes Turned Out  - 1 x daily - 7 x weekly - 2 sets - 10 reps - Standing Heel Raise with Toes Turned In  - 1 x daily - 7 x weekly - 2 sets - 10 reps - Mini Squat with Counter Support  - 1 x daily - 7 x weekly - 3 sets - 10 reps - Carioca with Counter Support  - 1 x daily - 7 x weekly - 3 sets - 10 reps     PT Short Term Goals - 01/27/22 0911       PT SHORT TERM GOAL #1   Title Ind with initial HEP    Baseline -    Time 2    Period Weeks    Status Achieved    Target Date 12/26/21      PT SHORT TERM GOAL #2   Title Pt able to ascend/descend her steps safely in CAM boot with or without AD and one UE assist.    Time 2    Period Weeks    Status Achieved    Target Date 12/26/21              PT Long Term Goals - 01/16/22 0927       PT LONG TERM GOAL #1   Title Improve FOTO to 63 showing functional improvement    Baseline 43 on 01/09/22    Time 6    Period Weeks    Status Revised    Target Date 02/27/22      PT LONG TERM GOAL #2   Title Patient able to ambulate community distances with a normal gait pattern and ascend/descend stairs without difficulty.    Baseline -    Time 6    Period Weeks    Status Revised    Target Date 02/27/22      PT LONG TERM GOAL #3   Title Improve B heel cords flexibility to >= 10 degrees to normalize gait.    Baseline -    Time 6    Period Weeks    Status Revised    Target Date 02/27/22      PT LONG TERM GOAL #4   Title Demonstrate 5/5 R ankle strength    Baseline -    Time 6    Period Weeks    Status Revised    Target Date 02/27/22      PT LONG TERM GOAL #5   Title Heidi Crawford will be independent with her long-term HEP at  DC.    Baseline -    Time 6    Period Weeks    Status Revised    Target Date 02/27/22              Plan    Clinical Impression Statement Continued L foot/ankle stabilization  exercises in standing. Continued to improve hindfoot mobility   Examination-Activity Limitations Locomotion Level;Stairs    Stability/Clinical Decision Making Stable/Uncomplicated    Rehab Potential Excellent    PT Frequency 2x / week    PT Duration 6 weeks    PT Treatment/Interventions ADLs/Self Care Home Management;Aquatic Therapy;Cryotherapy;Electrical Stimulation;Iontophoresis '4mg'$ /ml Dexamethasone;Moist Heat;Balance training;Therapeutic exercise;Therapeutic activities;Functional mobility training;Stair training;Gait training;Neuromuscular re-education;Patient/family education;Dry needling;Manual techniques;Taping;Vasopneumatic Device    PT Next Visit Plan Work on ROM/strength per protocol, progress HEP    PT Home Exercise Plan [phone]    Consulted and Agree with Plan of Care Patient        [facility] Beatrix Shipper [place], PT, DPT 02/18/2022, 10:15 AM

## 2022-02-21 ENCOUNTER — Ambulatory Visit: Payer: BC Managed Care – PPO | Admitting: Physical Therapy

## 2022-02-21 ENCOUNTER — Encounter: Payer: Self-pay | Admitting: Physical Therapy

## 2022-02-21 DIAGNOSIS — R600 Localized edema: Secondary | ICD-10-CM

## 2022-02-21 DIAGNOSIS — R262 Difficulty in walking, not elsewhere classified: Secondary | ICD-10-CM

## 2022-02-21 DIAGNOSIS — M6281 Muscle weakness (generalized): Secondary | ICD-10-CM

## 2022-02-21 DIAGNOSIS — M25572 Pain in left ankle and joints of left foot: Secondary | ICD-10-CM

## 2022-02-21 DIAGNOSIS — M25672 Stiffness of left ankle, not elsewhere classified: Secondary | ICD-10-CM | POA: Diagnosis not present

## 2022-02-21 DIAGNOSIS — R2689 Other abnormalities of gait and mobility: Secondary | ICD-10-CM | POA: Diagnosis not present

## 2022-02-21 NOTE — Therapy (Signed)
OUTPATIENT PHYSICAL THERAPY TREATMENT NOTE   Patient Name: Heidi Crawford MRN: [id number] DOB:March 30, 1961, 61 y.o., female Today's Date: 02/21/2022  PCP: Heidi Crawford PROVIDER: Meridee Crawford   PT End of Session - 02/21/22 0844     Visit Number 20    Date for PT Re-Evaluation 02/27/22    Authorization Type BCBS    PT Start Time 0845    PT Stop Time 0930    PT Time Calculation (min) 45 min    Activity Tolerance Patient tolerated treatment well    Behavior During Therapy WFL for tasks assessed/performed               Past Medical History:  Diagnosis Date   Allergy    seasonal   Dependent edema    GERD (gastroesophageal reflux disease)    Headache(784.0)    Hyperlipemia    Hyperlipidemia    Hypertension    Pneumonia    Squamous cell cancer of skin of forearm    right arm    TMJ pain dysfunction syndrome    Past Surgical History:  Procedure Laterality Date   COLONOSCOPY  09/08/2014   per Dr. Henrene Crawford, clear, repeat in 10 yrs    [place] Left 10/25/2021   Procedure: EXCISION LEFT HAGLUND'S;  Surgeon: Heidi Minion, MD;  Location: [facility];  Service: Orthopedics;  Laterality: Left;   removal skin cancer  2014   from right arm per Dr. Delman Crawford    Patient Active Problem List   Diagnosis Date Noted   Haglund's deformity of left heel    GERD (gastroesophageal reflux disease) 05/20/2019   Hyperlipidemia 05/04/2012   Essential hypertension 03/05/2009   COUGH 12/27/2007   ALLERGIC RHINITIS 12/03/2007   DEPENDENT EDEMA 11/10/2007   TMJ PAIN 03/04/2007   HEADACHE 03/04/2007    REFERRING DIAG: L-5 Haglund's deformity + achilles tendon repair 10/25/21 WBAT  THERAPY DIAG:  Stiffness of left ankle, not elsewhere classified  Other abnormalities of gait and mobility  Muscle weakness (generalized)  Difficulty walking  Localized edema  Pain in left ankle and joints of left foot  Rationale for  Evaluation and Treatment Rehabilitation  PERTINENT HISTORY: L-5 Haglund's deformity + achilles tendon repair 10/25/21  PRECAUTIONS: None  SUBJECTIVE: Pt notes some increased soreness in ankle the day after therapy. Feeling okay now.     PAIN:  Are you having pain? No    TODAY'S TREATMENT:  02/21/22 L1; 3 min fwd, 3 min bwd on recumbent bike Rockerboard:  DF stretch x30 sec  A/P 2x10  Lateral 2x10  Static balance laterally 2x30 sec   Static balance A/P 2x30 sec  Standing on blue side of bosu:   Inv 2x10   Ev 2x10  4" step:   Forward weight shift with L knee bend 2x10   Forward step up 2x10 with bilat UE support   Side step up 2x10   Forward step down 2x10 with bilat UE support   Heel raise 2x10  Standing   Sit<>stand x10   Wall slide   Half kneeling   Knee flexion into wall 2x10   Knee flexion into wall with ankle MWM for DF 2x10  Manual therapy:   Grade II to III ankle DF mobilization and talocrural distraction  02/18/22  L1; 3 min fwd, 3 min bwd on recumbent bike  Manual therapy: Grade II to III ankle mobilizations for DF, inv/ev (hindfoot) PROM ankle/foot in all directions  Seated:  Ankle circle AROM 2x10 CW & CCW Standing on rockerboard:  A/P 2x10  Lateral 2x10 Standing on blue side of bosu:  Inv 2x10  Ev 2x10 Standing on airex:  L step ups 2x10  L SLS 2x20 sec with UE support  Tandem stance L foot back EO 2x30 sec  Mini squat 2x10  Heel raise 2x10 toes in and then toes out    PATIENT EDUCATION: Education details: See above Person educated: Patient Education method: Explanation, Verbal cues, and Handouts Education comprehension: verbalized understanding, returned demonstration, and needs further education   HOME EXERCISE PROGRAM: Access Code: [email or web address] URL: [email or web address] Date: 02/18/2022 Prepared by: Heidi Crawford  Exercises - Single Leg Balance with Clock Reach  - 1 x daily - 7 x weekly - 2 sets - 10 reps -  Seated Ankle Alphabet  - 1 x daily - 7 x weekly - 1 sets - Gastroc Stretch on Wall  - 1 x daily - 7 x weekly - 2 sets - 30 sec hold - Soleus Stretch on Wall  - 1 x daily - 7 x weekly - 2 sets - 30 sec hold - Heel Toe Raises with Counter Support  - 1 x daily - 7 x weekly - 1 sets - 10 reps - Standing Heel Raise with Toes Turned Out  - 1 x daily - 7 x weekly - 2 sets - 10 reps - Standing Heel Raise with Toes Turned In  - 1 x daily - 7 x weekly - 2 sets - 10 reps - Mini Squat with Counter Support  - 1 x daily - 7 x weekly - 3 sets - 10 reps - Carioca with Counter Support  - 1 x daily - 7 x weekly - 3 sets - 10 reps     PT Short Term Goals - 01/27/22 0911       PT SHORT TERM GOAL #1   Title Ind with initial HEP    Baseline -    Time 2    Period Weeks    Status Achieved    Target Date 12/26/21      PT SHORT TERM GOAL #2   Title Pt able to ascend/descend her steps safely in CAM boot with or without AD and one UE assist.    Time 2    Period Weeks    Status Achieved    Target Date 12/26/21              PT Long Term Goals - 01/16/22 0927       PT LONG TERM GOAL #1   Title Improve FOTO to 63 showing functional improvement    Baseline 43 on 01/09/22    Time 6    Period Weeks    Status Revised    Target Date 02/27/22      PT LONG TERM GOAL #2   Title Patient able to ambulate community distances with a normal gait pattern and ascend/descend stairs without difficulty.    Baseline -    Time 6    Period Weeks    Status Revised    Target Date 02/27/22      PT LONG TERM GOAL #3   Title Improve B heel cords flexibility to >= 10 degrees to normalize gait.    Baseline -    Time 6    Period Weeks    Status Revised    Target Date 02/27/22      PT LONG TERM GOAL #4  Title Demonstrate 5/5 R ankle strength    Baseline -    Time 6    Period Weeks    Status Revised    Target Date 02/27/22      PT LONG TERM GOAL #5   Title Heidi Crawford will be independent with her long-term HEP at  DC.    Baseline -    Time 6    Period Weeks    Status Revised    Target Date 02/27/22              Plan    Clinical Impression Statement Session focused on continuing exercises in standing. Worked primarily on ankle DF with knee bend.   Examination-Activity Limitations Locomotion Level;Stairs    Stability/Clinical Decision Making Stable/Uncomplicated    Rehab Potential Excellent    PT Frequency 2x / week    PT Duration 6 weeks    PT Treatment/Interventions ADLs/Self Care Home Management;Aquatic Therapy;Cryotherapy;Electrical Stimulation;Iontophoresis '4mg'$ /ml Dexamethasone;Moist Heat;Balance training;Therapeutic exercise;Therapeutic activities;Functional mobility training;Stair training;Gait training;Neuromuscular re-education;Patient/family education;Dry needling;Manual techniques;Taping;Vasopneumatic Device    PT Next Visit Plan Work on ROM/strength per protocol, progress HEP    PT Home Exercise Plan [phone]    Consulted and Agree with Plan of Care Patient        [facility] Beatrix Shipper Dickens, PT, DPT 02/21/2022, 8:46 AM

## 2022-02-25 ENCOUNTER — Encounter: Payer: Self-pay | Admitting: Physical Therapy

## 2022-02-25 ENCOUNTER — Ambulatory Visit: Payer: BC Managed Care – PPO | Admitting: Physical Therapy

## 2022-02-25 DIAGNOSIS — M25672 Stiffness of left ankle, not elsewhere classified: Secondary | ICD-10-CM

## 2022-02-25 DIAGNOSIS — M6281 Muscle weakness (generalized): Secondary | ICD-10-CM | POA: Diagnosis not present

## 2022-02-25 DIAGNOSIS — M25572 Pain in left ankle and joints of left foot: Secondary | ICD-10-CM | POA: Diagnosis not present

## 2022-02-25 DIAGNOSIS — R262 Difficulty in walking, not elsewhere classified: Secondary | ICD-10-CM | POA: Diagnosis not present

## 2022-02-25 DIAGNOSIS — R2689 Other abnormalities of gait and mobility: Secondary | ICD-10-CM | POA: Diagnosis not present

## 2022-02-25 DIAGNOSIS — R600 Localized edema: Secondary | ICD-10-CM

## 2022-02-25 NOTE — Therapy (Signed)
OUTPATIENT PHYSICAL THERAPY TREATMENT NOTE   Patient Name: Heidi Crawford MRN: [id number] DOB:19-Jul-1960, 61 y.o., female Today's Date: 02/25/2022  PCP: Heidi Crawford PROVIDER: Meridee Crawford   PT End of Session - 02/25/22 0932     Visit Number 21    Date for PT Re-Evaluation 02/27/22    Authorization Type BCBS    PT Start Time 0932    PT Stop Time 1015    PT Time Calculation (min) 43 min    Activity Tolerance Patient tolerated treatment well    Behavior During Therapy WFL for tasks assessed/performed               Past Medical History:  Diagnosis Date   Allergy    seasonal   Dependent edema    GERD (gastroesophageal reflux disease)    Headache(784.0)    Hyperlipemia    Hyperlipidemia    Hypertension    Pneumonia    Squamous cell cancer of skin of forearm    right arm    TMJ pain dysfunction syndrome    Past Surgical History:  Procedure Laterality Date   COLONOSCOPY  09/08/2014   per Dr. Henrene Crawford, clear, repeat in 10 yrs    [place] Left 10/25/2021   Procedure: EXCISION LEFT HAGLUND'S;  Surgeon: Heidi Minion, MD;  Location: [facility];  Service: Orthopedics;  Laterality: Left;   removal skin cancer  2014   from right arm per Dr. Delman Crawford    Patient Active Problem List   Diagnosis Date Noted   Haglund's deformity of left heel    GERD (gastroesophageal reflux disease) 05/20/2019   Hyperlipidemia 05/04/2012   Essential hypertension 03/05/2009   COUGH 12/27/2007   ALLERGIC RHINITIS 12/03/2007   DEPENDENT EDEMA 11/10/2007   TMJ PAIN 03/04/2007   HEADACHE 03/04/2007    REFERRING DIAG: L-5 Haglund's deformity + achilles tendon repair 10/25/21 WBAT  THERAPY DIAG:  Stiffness of left ankle, not elsewhere classified  Other abnormalities of gait and mobility  Muscle weakness (generalized)  Difficulty walking  Localized edema  Pain in left ankle and joints of left foot  Rationale for  Evaluation and Treatment Rehabilitation  PERTINENT HISTORY: L-5 Haglund's deformity + achilles tendon repair 10/25/21  PRECAUTIONS: None  SUBJECTIVE: Pt feels she has not made too much progress with her ankle bending.      PAIN:  Are you having pain? No   TODAY'S TREATMENT:  02/25/22  L1; 3 min fwd, 3 min bwd on recumbent bike  Standing:  Gastroc stretch off 4" step 2x30 sec  Soleus stretch off 4" step 2x30 sec  Wall squat 2x10  Forward Step ups 2x10  Heel raise off 6" step 2x10 Half kneeling   Knee flexion into wall with ankle MWM for DF 2x10  Manual therapy:   Grade II to III talocrural distraction and ankle DF   Hindfoot inv/ev grade II to III   Metatarsal PAs grade II to III   Ankle PROM CW & CCW x10 each  Machines   Leg press 6 plates, seat on 4; 2x10     02/21/22 L1; 3 min fwd, 3 min bwd on recumbent bike Rockerboard:  DF stretch x30 sec  A/P 2x10  Lateral 2x10  Static balance laterally 2x30 sec   Static balance A/P 2x30 sec  Standing on blue side of bosu:   Inv 2x10   Ev 2x10  4" step:   Forward weight shift with  L knee bend 2x10   Forward step up 2x10 with bilat UE support   Side step up 2x10   Forward step down 2x10 with bilat UE support   Heel raise 2x10  Standing   Sit<>stand x10   Wall slide   Half kneeling   Knee flexion into wall 2x10   Knee flexion into wall with ankle MWM for DF 2x10  Manual therapy:   Grade II to III ankle DF mobilization and talocrural distraction    PATIENT EDUCATION: Education details: See above Person educated: Patient Education method: Explanation, Verbal cues, and Handouts Education comprehension: verbalized understanding, returned demonstration, and needs further education   HOME EXERCISE PROGRAM: Access Code: [email or web address] URL: [email or web address] Date: 02/18/2022 Prepared by: Heidi Crawford  Exercises - Single Leg Balance with Clock Reach  - 1 x daily - 7 x weekly - 2 sets - 10  reps - Seated Ankle Alphabet  - 1 x daily - 7 x weekly - 1 sets - Gastroc Stretch on Wall  - 1 x daily - 7 x weekly - 2 sets - 30 sec hold - Soleus Stretch on Wall  - 1 x daily - 7 x weekly - 2 sets - 30 sec hold - Heel Toe Raises with Counter Support  - 1 x daily - 7 x weekly - 1 sets - 10 reps - Standing Heel Raise with Toes Turned Out  - 1 x daily - 7 x weekly - 2 sets - 10 reps - Standing Heel Raise with Toes Turned In  - 1 x daily - 7 x weekly - 2 sets - 10 reps - Mini Squat with Counter Support  - 1 x daily - 7 x weekly - 3 sets - 10 reps - Carioca with Counter Support  - 1 x daily - 7 x weekly - 3 sets - 10 reps     PT Short Term Goals - 01/27/22 0911       PT SHORT TERM GOAL #1   Title Ind with initial HEP    Baseline -    Time 2    Period Weeks    Status Achieved    Target Date 12/26/21      PT SHORT TERM GOAL #2   Title Pt able to ascend/descend her steps safely in CAM boot with or without AD and one UE assist.    Time 2    Period Weeks    Status Achieved    Target Date 12/26/21              PT Long Term Goals - 01/16/22 0927       PT LONG TERM GOAL #1   Title Improve FOTO to 63 showing functional improvement    Baseline 43 on 01/09/22    Time 6    Period Weeks    Status Revised    Target Date 02/27/22      PT LONG TERM GOAL #2   Title Patient able to ambulate community distances with a normal gait pattern and ascend/descend stairs without difficulty.    Baseline -    Time 6    Period Weeks    Status Revised    Target Date 02/27/22      PT LONG TERM GOAL #3   Title Improve B heel cords flexibility to >= 10 degrees to normalize gait.    Baseline -    Time 6    Period Weeks    Status Revised  Target Date 02/27/22      PT LONG TERM GOAL #4   Title Demonstrate 5/5 R ankle strength    Baseline -    Time 6    Period Weeks    Status Revised    Target Date 02/27/22      PT LONG TERM GOAL #5   Title Heidi Crawford will be independent with her long-term  HEP at DC.    Baseline -    Time 6    Period Weeks    Status Revised    Target Date 02/27/22              Plan    Clinical Impression Statement Session focused on improving ankle DF -- pt is demonstrating increased ROM. Working on [place] with knee bend and ankle DF.    Examination-Activity Limitations Locomotion Level;Stairs    Stability/Clinical Decision Making Stable/Uncomplicated    Rehab Potential Excellent    PT Frequency 2x / week    PT Duration 6 weeks    PT Treatment/Interventions ADLs/Self Care Home Management;Aquatic Therapy;Cryotherapy;Electrical Stimulation;Iontophoresis '4mg'$ /ml Dexamethasone;Moist Heat;Balance training;Therapeutic exercise;Therapeutic activities;Functional mobility training;Stair training;Gait training;Neuromuscular re-education;Patient/family education;Dry needling;Manual techniques;Taping;Vasopneumatic Device    PT Next Visit Plan Work on ROM/strength per protocol, progress HEP    PT Home Exercise Plan [phone]    Consulted and Agree with Plan of Care Patient        [facility] Beatrix Shipper Clayton, PT, DPT 02/25/2022, 9:33 AM

## 2022-02-27 ENCOUNTER — Ambulatory Visit: Payer: BC Managed Care – PPO | Admitting: Physical Therapy

## 2022-02-27 ENCOUNTER — Encounter: Payer: Self-pay | Admitting: Physical Therapy

## 2022-02-27 DIAGNOSIS — M6281 Muscle weakness (generalized): Secondary | ICD-10-CM | POA: Diagnosis not present

## 2022-02-27 DIAGNOSIS — M25572 Pain in left ankle and joints of left foot: Secondary | ICD-10-CM

## 2022-02-27 DIAGNOSIS — R600 Localized edema: Secondary | ICD-10-CM

## 2022-02-27 DIAGNOSIS — M25672 Stiffness of left ankle, not elsewhere classified: Secondary | ICD-10-CM

## 2022-02-27 DIAGNOSIS — R262 Difficulty in walking, not elsewhere classified: Secondary | ICD-10-CM | POA: Diagnosis not present

## 2022-02-27 DIAGNOSIS — R2689 Other abnormalities of gait and mobility: Secondary | ICD-10-CM

## 2022-02-27 NOTE — Therapy (Signed)
OUTPATIENT PHYSICAL THERAPY TREATMENT NOTE   Patient Name: Heidi Crawford MRN: [id number] DOB:02/06/1961, 61 y.o., female Today's Date: 02/27/2022  PCP: Maia Petties PROVIDER: Meridee Score   PT End of Session - 02/27/22 0851     Visit Number 22    Date for PT Re-Evaluation 02/27/22    Authorization Type BCBS    PT Start Time [phone]    PT Stop Time 0930    PT Time Calculation (min) 41 min    Activity Tolerance Patient tolerated treatment well    Behavior During Therapy [facility] for tasks assessed/performed               Past Medical History:  Diagnosis Date   Allergy    seasonal   Dependent edema    GERD (gastroesophageal reflux disease)    Headache(784.0)    Hyperlipemia    Hyperlipidemia    Hypertension    Pneumonia    Squamous cell cancer of skin of forearm    right arm    TMJ pain dysfunction syndrome    Past Surgical History:  Procedure Laterality Date   COLONOSCOPY  09/08/2014   per Dr. Henrene Pastor, clear, repeat in 10 yrs    [place] Left 10/25/2021   Procedure: EXCISION LEFT HAGLUND'S;  Surgeon: Newt Minion, MD;  Location: [place];  Service: Orthopedics;  Laterality: Left;   removal skin cancer  2014   from right arm per Dr. Delman Cheadle    Patient Active Problem List   Diagnosis Date Noted   Haglund's deformity of left heel    GERD (gastroesophageal reflux disease) 05/20/2019   Hyperlipidemia 05/04/2012   Essential hypertension 03/05/2009   COUGH 12/27/2007   ALLERGIC RHINITIS 12/03/2007   DEPENDENT EDEMA 11/10/2007   TMJ PAIN 03/04/2007   HEADACHE 03/04/2007    REFERRING DIAG: L-5 Haglund's deformity + achilles tendon repair 10/25/21 WBAT  THERAPY DIAG:  Stiffness of left ankle, not elsewhere classified  Other abnormalities of gait and mobility  Muscle weakness (generalized)  Difficulty walking  Localized edema  Pain in left ankle and joints of left foot  Rationale for  Evaluation and Treatment Rehabilitation  PERTINENT HISTORY: L-5 Haglund's deformity + achilles tendon repair 10/25/21  PRECAUTIONS: None  SUBJECTIVE: Pt feels she has not made too much progress with her ankle bending.      PAIN:  Are you having pain? No   TODAY'S TREATMENT:  02/27/22  L1; 3 min fwd, 3 min bwd on recumbent bike  Manual therapy:   Grade II to III talocrural distraction and ankle DF   Hindfoot inv/ev grade II to III   Metatarsal PAs grade II to III   Ankle PROM CW & CCW x10 each  Standing:   Slant board gastroc stretch 2x30 sec   Slant board soleus stretch 2x30 sec   Wall squat 2x10   Wide tandem stance with heel raise 2x10   Trialed eccentric heel raise for L LE but pt with too much difficulty Half kneeling   Knee flexion into wall with ankle MWM for DF 2x10  On blue theraband foam   SLS with intermittent UE support 3x20 sec   02/25/22  L1; 3 min fwd, 3 min bwd on recumbent bike  Standing:  Gastroc stretch off 4" step 2x30 sec  Soleus stretch off 4" step 2x30 sec  Wall squat 2x10  Forward Step ups 2x10  Heel raise off 6" step 2x10 Half  kneeling   Knee flexion into wall with ankle MWM for DF 2x10  Manual therapy:   Grade II to III talocrural distraction and ankle DF   Hindfoot inv/ev grade II to III   Metatarsal PAs grade II to III   Ankle PROM CW & CCW x10 each  Machines   Leg press 6 plates, seat on 4; 2x10     02/21/22 L1; 3 min fwd, 3 min bwd on recumbent bike Rockerboard:  DF stretch x30 sec  A/P 2x10  Lateral 2x10  Static balance laterally 2x30 sec   Static balance A/P 2x30 sec  Standing on blue side of bosu:   Inv 2x10   Ev 2x10  4" step:   Forward weight shift with L knee bend 2x10   Forward step up 2x10 with bilat UE support   Side step up 2x10   Forward step down 2x10 with bilat UE support   Heel raise 2x10  Standing   Sit<>stand x10   Wall slide   Half kneeling   Knee flexion into wall 2x10   Knee flexion into wall with  ankle MWM for DF 2x10  Manual therapy:   Grade II to III ankle DF mobilization and talocrural distraction    PATIENT EDUCATION: Education details: See above Person educated: Patient Education method: Explanation, Verbal cues, and Handouts Education comprehension: verbalized understanding, returned demonstration, and needs further education   HOME EXERCISE PROGRAM: Access Code: [email or web address] URL: [email or web address] Date: 02/18/2022 Prepared by: Estill Bamberg April Thurnell Garbe  Exercises - Single Leg Balance with Clock Reach  - 1 x daily - 7 x weekly - 2 sets - 10 reps - Seated Ankle Alphabet  - 1 x daily - 7 x weekly - 1 sets - Gastroc Stretch on Wall  - 1 x daily - 7 x weekly - 2 sets - 30 sec hold - Soleus Stretch on Wall  - 1 x daily - 7 x weekly - 2 sets - 30 sec hold - Heel Toe Raises with Counter Support  - 1 x daily - 7 x weekly - 1 sets - 10 reps - Standing Heel Raise with Toes Turned Out  - 1 x daily - 7 x weekly - 2 sets - 10 reps - Standing Heel Raise with Toes Turned In  - 1 x daily - 7 x weekly - 2 sets - 10 reps - Mini Squat with Counter Support  - 1 x daily - 7 x weekly - 3 sets - 10 reps - Carioca with Counter Support  - 1 x daily - 7 x weekly - 3 sets - 10 reps     PT Short Term Goals - 01/27/22 0911       PT SHORT TERM GOAL #1   Title Ind with initial HEP    Baseline -    Time 2    Period Weeks    Status Achieved    Target Date 12/26/21      PT SHORT TERM GOAL #2   Title Pt able to ascend/descend her steps safely in CAM boot with or without AD and one UE assist.    Time 2    Period Weeks    Status Achieved    Target Date 12/26/21              PT Long Term Goals - 01/16/22 0927       PT LONG TERM GOAL #1   Title Improve FOTO to 63 showing functional improvement  Baseline 43 on 01/09/22    Time 6    Period Weeks    Status Revised    Target Date 02/27/22      PT LONG TERM GOAL #2   Title Patient able to ambulate community  distances with a normal gait pattern and ascend/descend stairs without difficulty.    Baseline -    Time 6    Period Weeks    Status Revised    Target Date 02/27/22      PT LONG TERM GOAL #3   Title Improve B heel cords flexibility to >= 10 degrees to normalize gait.    Baseline -    Time 6    Period Weeks    Status Revised    Target Date 02/27/22      PT LONG TERM GOAL #4   Title Demonstrate 5/5 R ankle strength    Baseline -    Time 6    Period Weeks    Status Revised    Target Date 02/27/22      PT LONG TERM GOAL #5   Title Keishana will be independent with her long-term HEP at [place].    Baseline -    Time 6    Period Weeks    Status Revised    Target Date 02/27/22              Plan    Clinical Impression Statement Session focused on improving ankle DF -- pt is demonstrating increased ROM. Working on [place] with knee bend and ankle DF.    Examination-Activity Limitations Locomotion Level;Stairs    Stability/Clinical Decision Making Stable/Uncomplicated    Rehab Potential Excellent    PT Frequency 2x / week    PT Duration 6 weeks    PT Treatment/Interventions ADLs/Self Care Home Management;Aquatic Therapy;Cryotherapy;Electrical Stimulation;Iontophoresis '4mg'$ /ml Dexamethasone;Moist Heat;Balance training;Therapeutic exercise;Therapeutic activities;Functional mobility training;Stair training;Gait training;Neuromuscular re-education;Patient/family education;Dry needling;Manual techniques;Taping;Vasopneumatic Device    PT Next Visit Plan Work on ROM/strength per protocol, progress HEP    PT Home Exercise Plan [phone]    Consulted and Agree with Plan of Care Patient        [facility] Beatrix Shipper [place], PT, DPT 02/27/2022, 8:51 AM

## 2022-03-03 ENCOUNTER — Ambulatory Visit: Payer: BC Managed Care – PPO | Admitting: Rehabilitative and Restorative Service Providers"

## 2022-03-06 ENCOUNTER — Encounter: Payer: Self-pay | Admitting: Physical Therapy

## 2022-03-06 ENCOUNTER — Ambulatory Visit: Payer: BC Managed Care – PPO | Admitting: Physical Therapy

## 2022-03-06 DIAGNOSIS — R2689 Other abnormalities of gait and mobility: Secondary | ICD-10-CM | POA: Diagnosis not present

## 2022-03-06 DIAGNOSIS — M6281 Muscle weakness (generalized): Secondary | ICD-10-CM

## 2022-03-06 DIAGNOSIS — R600 Localized edema: Secondary | ICD-10-CM

## 2022-03-06 DIAGNOSIS — M25672 Stiffness of left ankle, not elsewhere classified: Secondary | ICD-10-CM

## 2022-03-06 DIAGNOSIS — R262 Difficulty in walking, not elsewhere classified: Secondary | ICD-10-CM

## 2022-03-06 DIAGNOSIS — M25572 Pain in left ankle and joints of left foot: Secondary | ICD-10-CM | POA: Diagnosis not present

## 2022-03-06 NOTE — Therapy (Signed)
OUTPATIENT PHYSICAL THERAPY TREATMENT NOTE   Patient Name: Heidi Crawford MRN: [id number] DOB:12/07/1960, 61 y.o., female Today's Date: 03/06/2022  PCP: Fry, Stephen REFERRING PROVIDER: Duda, Marcus   PT End of Session - 03/06/22 0851     Visit Number 23    Date for PT Re-Evaluation 02/27/22    Authorization Type BCBS    PT Start Time 0847    PT Stop Time 0930    PT Time Calculation (min) 43 min    Activity Tolerance Patient tolerated treatment well    Behavior During Therapy WFL for tasks assessed/performed               Past Medical History:  Diagnosis Date   Allergy    seasonal   Dependent edema    GERD (gastroesophageal reflux disease)    Headache(784.0)    Hyperlipemia    Hyperlipidemia    Hypertension    Pneumonia    Squamous cell cancer of skin of forearm    right arm    TMJ pain dysfunction syndrome    Past Surgical History:  Procedure Laterality Date   COLONOSCOPY  09/08/2014   per Dr. Perry, clear, repeat in 10 yrs    DENTAL SURGERY     EXCISION HAGLUND'S DEFORMITY WITH ACHILLES TENDON REPAIR Left 10/25/2021   Procedure: EXCISION LEFT HAGLUND'S;  Surgeon: Duda, Marcus V, MD;  Location: [facility];  Service: Orthopedics;  Laterality: Left;   removal skin cancer  2014   from right arm per Dr. Gould    Patient Active Problem List   Diagnosis Date Noted   Haglund's deformity of left heel    GERD (gastroesophageal reflux disease) 05/20/2019   Hyperlipidemia 05/04/2012   Essential hypertension 03/05/2009   COUGH 12/27/2007   ALLERGIC RHINITIS 12/03/2007   DEPENDENT EDEMA 11/10/2007   TMJ PAIN 03/04/2007   HEADACHE 03/04/2007    REFERRING DIAG: L-5 Haglund's deformity + achilles tendon repair 10/25/21 [facility]  THERAPY DIAG:  Stiffness of left ankle, not elsewhere classified  Other abnormalities of gait and mobility  Muscle weakness (generalized)  Difficulty walking  Localized edema  Pain in left ankle and joints of left foot  Rationale for  Evaluation and Treatment Rehabilitation  PERTINENT HISTORY: L-5 Haglund's deformity + achilles tendon repair 10/25/21  PRECAUTIONS: None  SUBJECTIVE: Pt reports she continues to do her exercises well.       PAIN:  Are you having pain? No   TODAY'S TREATMENT:  03/06/22  Standing   Gastroc stretch 2x30 sec   Soleus stretch 2x30 sec   Knee forward flexion on step x30 sec   SLS 2x30 sec   Mini squat 2x10   Tandem stance heel raise 2x10   Heel raise 2x10   Seated   Ankle inv green tband 2x10   Manual therapy   Grade II to III talocrural distraction and ankle DF   Hindfoot inv/ev grade II to III   Metatarsal PAs grade II to III   On slight grassy incline:   Forwards/backwards walking 2x30'   Lateral stepping 4x30'   Machine strengthening   Ankle PF with knees straight @ 5 plates SL on L 2x10   Ankle PF with knees bent @ 5 plates SL on L 2x10   02/27/22  L1; 3 min fwd, 3 min bwd on recumbent bike  Manual therapy:   Grade II to III talocrural distraction and ankle DF   Hindfoot inv/ev grade II to III   Metatarsal PAs grade II to   III   Ankle PROM CW & CCW x10 each  Standing:   Slant board gastroc stretch 2x30 sec   Slant board soleus stretch 2x30 sec   Wall squat 2x10   Wide tandem stance with heel raise 2x10   Trialed eccentric heel raise for L LE but pt with too much difficulty Half kneeling   Knee flexion into wall with ankle MWM for DF 2x10  On blue theraband foam   SLS with intermittent UE support 3x20 sec   02/25/22  L1; 3 min fwd, 3 min bwd on recumbent bike  Standing:  Gastroc stretch off 4" step 2x30 sec  Soleus stretch off 4" step 2x30 sec  Wall squat 2x10  Forward Step ups 2x10  Heel raise off 6" step 2x10 Half kneeling   Knee flexion into wall with ankle MWM for DF 2x10  Manual therapy:   Grade II to III talocrural distraction and ankle DF   Hindfoot inv/ev grade II to III   Metatarsal PAs grade II to III   Ankle PROM CW & CCW x10  each  Machines   Leg press 6 plates, seat on 4; 2x10     02/21/22 L1; 3 min fwd, 3 min bwd on recumbent bike Rockerboard:  DF stretch x30 sec  A/P 2x10  Lateral 2x10  Static balance laterally 2x30 sec   Static balance A/P 2x30 sec  Standing on blue side of bosu:   Inv 2x10   Ev 2x10  4" step:   Forward weight shift with L knee bend 2x10   Forward step up 2x10 with bilat UE support   Side step up 2x10   Forward step down 2x10 with bilat UE support   Heel raise 2x10  Standing   Sit<>stand x10   Wall slide   Half kneeling   Knee flexion into wall 2x10   Knee flexion into wall with ankle MWM for DF 2x10  Manual therapy:   Grade II to III ankle DF mobilization and talocrural distraction    PATIENT EDUCATION: Education details: See above Person educated: Patient Education method: Explanation, Verbal cues, and Handouts Education comprehension: verbalized understanding, returned demonstration, and needs further education   HOME EXERCISE PROGRAM: Access Code: [email or web address] URL: [email or web address] Date: 02/18/2022 Prepared by: Gellen April Marie Nonato  Exercises - Single Leg Balance with Clock Reach  - 1 x daily - 7 x weekly - 2 sets - 10 reps - Seated Ankle Alphabet  - 1 x daily - 7 x weekly - 1 sets - Gastroc Stretch on Wall  - 1 x daily - 7 x weekly - 2 sets - 30 sec hold - Soleus Stretch on Wall  - 1 x daily - 7 x weekly - 2 sets - 30 sec hold - Heel Toe Raises with Counter Support  - 1 x daily - 7 x weekly - 1 sets - 10 reps - Standing Heel Raise with Toes Turned Out  - 1 x daily - 7 x weekly - 2 sets - 10 reps - Standing Heel Raise with Toes Turned In  - 1 x daily - 7 x weekly - 2 sets - 10 reps - Mini Squat with Counter Support  - 1 x daily - 7 x weekly - 3 sets - 10 reps - Carioca with Counter Support  - 1 x daily - 7 x weekly - 3 sets - 10 reps     PT Short Term Goals - 01/27/22 0911         PT SHORT TERM GOAL #1   Title Ind with initial HEP     Baseline -    Time 2    Period Weeks    Status Achieved    Target Date 12/26/21      PT SHORT TERM GOAL #2   Title Pt able to ascend/descend her steps safely in CAM boot with or without AD and one UE assist.    Time 2    Period Weeks    Status Achieved    Target Date 12/26/21            PRIOR GOALS:   PT Long Term Goals - 01/16/22 0927       PT LONG TERM GOAL #1   Title Improve FOTO to 63 showing functional improvement    Baseline 59 on 03/06/22   Time 6    Period Weeks    Status Partially met   Target Date 02/27/22      PT LONG TERM GOAL #2   Title Patient able to ambulate community distances with a normal gait pattern and ascend/descend stairs without difficulty.    Baseline -    Time 6    Period Weeks    Status Achieved   Target Date 02/27/22      PT LONG TERM GOAL #3   Title Improve B heel cords flexibility to >= 10 degrees to normalize gait.    Baseline -    Time 6    Period Weeks    Status Achieved   Target Date 02/27/22      PT LONG TERM GOAL #4   Title Demonstrate 5/5 R ankle strength    Baseline 4/5 L ankle inv   Time 6    Period Weeks    Status Partially met   Target Date 02/27/22      PT LONG TERM GOAL #5   Title Temari will be independent with her long-term HEP at [place].    Baseline -    Time 6    Period Weeks    Status Ongoing   Target Date 02/27/22             REVISED GOALS: To be completed by 04/03/2022  1.  Amera will be independent with her long-term HEP at [place] for return to community wellness Baseline:  Goal status: REVISED  2.  Demonstrate 5/5 L ankle strength Baseline: 4/5 ankle inv; not able to perform full single leg heel raise on L Goal status: REVISED  3.  Improve FOTO to 63 showing functional improvement  Baseline: 59 on 03/06/22 Goal status: REVISED  4.  Pt will be able to perform SLS on L LE for at least 10 sec to demo improved L foot/ankle stability Baseline:  Goal status: REVISED     Plan    Clinical  Impression Statement Rechecked pt's LTGs. She has been progressing well and progressing towards her goals. Greatest deficit is still some ankle plantarflexion and inversion weakness (unable to perform single leg heel raise on L), decreased foot/ankle stability, with L ankle DF not yet equal to R ankle due to continued edema affecting squatting. Pt would benefit from continued PT to fully address these deficits for full return to function.    Examination-Activity Limitations Locomotion Level;Stairs    Stability/Clinical Decision Making Stable/Uncomplicated    Rehab Potential Excellent    PT Frequency 1x / week    PT Duration 4 weeks    PT Treatment/Interventions ADLs/Self Care Home Management;Aquatic Therapy;Cryotherapy;Electrical Stimulation;Iontophoresis 4mg/ml   Dexamethasone;Moist Heat;Balance training;Therapeutic exercise;Therapeutic activities;Functional mobility training;Stair training;Gait training;Neuromuscular re-education;Patient/family education;Dry needling;Manual techniques;Taping;Vasopneumatic Device    PT Next Visit Plan Work on ROM/strength. Single leg stability, improve ankle PF strength, progress HEP    PT Home Exercise Plan [email or web address]    Consulted and Agree with Plan of Care Patient        Gellen April Ma L Nonato, PT, DPT 03/06/2022, 8:52 AM     

## 2022-03-09 ENCOUNTER — Other Ambulatory Visit: Payer: Self-pay | Admitting: Family Medicine

## 2022-03-17 ENCOUNTER — Other Ambulatory Visit: Payer: Self-pay

## 2022-03-17 ENCOUNTER — Encounter: Payer: Self-pay | Admitting: Family Medicine

## 2022-03-17 MED ORDER — SIMVASTATIN 20 MG PO TABS: 20.0000 mg | ORAL_TABLET | Freq: Every day | ORAL | 0 refills | Status: DC

## 2022-03-18 ENCOUNTER — Ambulatory Visit: Payer: BC Managed Care – PPO | Attending: Orthopedic Surgery | Admitting: Physical Therapy

## 2022-03-18 ENCOUNTER — Encounter: Payer: Self-pay | Admitting: Physical Therapy

## 2022-03-18 DIAGNOSIS — M6281 Muscle weakness (generalized): Secondary | ICD-10-CM | POA: Diagnosis present

## 2022-03-18 DIAGNOSIS — R600 Localized edema: Secondary | ICD-10-CM | POA: Insufficient documentation

## 2022-03-18 DIAGNOSIS — M25572 Pain in left ankle and joints of left foot: Secondary | ICD-10-CM | POA: Insufficient documentation

## 2022-03-18 DIAGNOSIS — M25672 Stiffness of left ankle, not elsewhere classified: Secondary | ICD-10-CM | POA: Diagnosis present

## 2022-03-18 DIAGNOSIS — R2689 Other abnormalities of gait and mobility: Secondary | ICD-10-CM | POA: Diagnosis present

## 2022-03-18 DIAGNOSIS — R262 Difficulty in walking, not elsewhere classified: Secondary | ICD-10-CM | POA: Diagnosis present

## 2022-03-18 NOTE — Therapy (Signed)
OUTPATIENT PHYSICAL THERAPY TREATMENT NOTE   Patient Name: Heidi Crawford MRN: [id number] DOB:[birth year], 61 y.o., female Today's Date: 03/18/2022  PCP: Heidi Crawford: Heidi Crawford   PT End of Session - 03/18/22 0844     Visit Number 24    Date for PT Re-Evaluation 04/03/22    Authorization Type BCBS    PT Start Time 0845    PT Stop Time 0925    PT Time Calculation (min) 40 min    Activity Tolerance Patient tolerated treatment well    Behavior During Therapy WFL for tasks assessed/performed               Past Medical History:  Diagnosis Date   Allergy    seasonal   Dependent edema    GERD (gastroesophageal reflux disease)    Headache(784.0)    Hyperlipemia    Hyperlipidemia    Hypertension    Pneumonia    Squamous cell cancer of skin of forearm    right arm    TMJ pain dysfunction syndrome    Past Surgical History:  Procedure Laterality Date   COLONOSCOPY  09/08/2014   per Heidi Crawford, clear, repeat in 10 yrs    [place] Left 10/25/2021   Procedure: EXCISION LEFT HAGLUND'S;  Surgeon: Heidi Minion, Heidi Crawford;  Location: [facility];  Service: Orthopedics;  Laterality: Left;   removal skin cancer  2014   from right arm per Dr. Delman Crawford    Patient Active Problem List   Diagnosis Date Noted   Haglund's deformity of left heel    GERD (gastroesophageal reflux disease) 05/20/2019   Hyperlipidemia 05/04/2012   Essential hypertension 03/05/2009   COUGH 12/27/2007   ALLERGIC RHINITIS 12/03/2007   DEPENDENT EDEMA 11/10/2007   TMJ PAIN 03/04/2007   HEADACHE 03/04/2007    REFERRING DIAG: L-5 Haglund's deformity + achilles tendon repair 10/25/21 [facility]  THERAPY DIAG:  Stiffness of left ankle, not elsewhere classified  Other abnormalities of gait and mobility  Muscle weakness (generalized)  Difficulty walking  Localized edema  Pain in left ankle and joints of left foot  Rationale for  Evaluation and Treatment Rehabilitation  PERTINENT HISTORY: L-5 Haglund's deformity + achilles tendon repair 10/25/21  PRECAUTIONS: None  SUBJECTIVE: Pt states her ankle has been doing well. Had a good vacation.      PAIN:  Are you having pain? No   TODAY'S TREATMENT:  03/18/22  L1 x 6 min recumbent bike  Standing   Gastroc stretch 2x30 sec   Soleus stretch 2x30 sec   Knee forward flexion 2x10   6" step step up 2x10   Tandem stance heel raise 2x10   Iso heel raise hold 2x10   6" step down 2x10    Neuro re-ed SLS 3x10 Rockerboard forward/bwd 2x10 Rockerboard L<>R 2x10 Rockerboard static balance A/P & laterally 2x30 sec each   03/06/22  Standing   Gastroc stretch 2x30 sec   Soleus stretch 2x30 sec   Knee forward flexion on step x30 sec   SLS 2x30 sec   Mini squat 2x10   Tandem stance heel raise 2x10   Heel raise 2x10   Seated   Ankle inv green tband 2x10   Manual therapy   Grade II to III talocrural distraction and ankle DF   Hindfoot inv/ev grade II to III   Metatarsal PAs grade II to III   On slight grassy incline:   Forwards/backwards walking  2x30'   Lateral stepping 4x30'   Machine strengthening   Ankle PF with knees straight @ 5 plates SL on L 4P32   Ankle PF with knees bent @ 5 plates SL on L 9J18   02/27/22  L1; 3 min fwd, 3 min bwd on recumbent bike  Manual therapy:   Grade II to III talocrural distraction and ankle DF   Hindfoot inv/ev grade II to III   Metatarsal PAs grade II to III   Ankle PROM CW & CCW x10 each  Standing:   Slant board gastroc stretch 2x30 sec   Slant board soleus stretch 2x30 sec   Wall squat 2x10   Wide tandem stance with heel raise 2x10   Trialed eccentric heel raise for L LE but pt with too much difficulty Half kneeling   Knee flexion into wall with ankle MWM for DF 2x10  On blue theraband foam   SLS with intermittent UE support 3x20 sec    PATIENT EDUCATION: Education details: See above Person educated:  Patient Education method: Explanation, Verbal cues, and Handouts Education comprehension: verbalized understanding, returned demonstration, and needs further education   HOME EXERCISE PROGRAM: Access Code: [email or web address] URL: [email or web address] Date: 02/18/2022 Prepared by: Heidi Crawford  Exercises - Single Leg Balance with Clock Reach  - 1 x daily - 7 x weekly - 2 sets - 10 reps - Seated Ankle Alphabet  - 1 x daily - 7 x weekly - 1 sets - Gastroc Stretch on Wall  - 1 x daily - 7 x weekly - 2 sets - 30 sec hold - Soleus Stretch on Wall  - 1 x daily - 7 x weekly - 2 sets - 30 sec hold - Heel Toe Raises with Counter Support  - 1 x daily - 7 x weekly - 1 sets - 10 reps - Standing Heel Raise with Toes Turned Out  - 1 x daily - 7 x weekly - 2 sets - 10 reps - Standing Heel Raise with Toes Turned In  - 1 x daily - 7 x weekly - 2 sets - 10 reps - Mini Squat with Counter Support  - 1 x daily - 7 x weekly - 3 sets - 10 reps - Carioca with Counter Support  - 1 x daily - 7 x weekly - 3 sets - 10 reps     PT Short Term Goals - 01/27/22 0911       PT SHORT TERM GOAL #1   Title Ind with initial HEP    Baseline -    Time 2    Period Weeks    Status Achieved    Target Date 12/26/21      PT SHORT TERM GOAL #2   Title Pt able to ascend/descend her steps safely in CAM boot with or without AD and one UE assist.    Time 2    Period Weeks    Status Achieved    Target Date 12/26/21              REVISED GOALS: To be completed by 04/03/2022  1.  Heidi Crawford will be independent with her long-term HEP at [place] for return to community wellness Baseline:  Goal status: IN PROGRESS  2.  Demonstrate 5/5 L ankle strength Baseline: 4/5 ankle inv; not able to perform full single leg heel raise on L Goal status: IN PROGRESS  3.  Improve FOTO to 63 showing functional improvement  Baseline: 59 on 03/06/22 Goal status:  IN PROGRESS  4.  Pt will be able to perform SLS on L LE for at  least 10 sec to demo improved L foot/ankle stability Baseline:  Goal status: MET     Plan    Clinical Impression Statement Session focused on continuing to improve ankle DF. Worked primarily on Occupational psychologist.    Examination-Activity Limitations Locomotion Level;Stairs    Stability/Clinical Decision Making Stable/Uncomplicated    Rehab Potential Excellent    PT Frequency 1x / week    PT Duration 4 weeks    PT Treatment/Interventions ADLs/Self Care Home Management;Aquatic Therapy;Cryotherapy;Electrical Stimulation;Iontophoresis 4mg /ml Dexamethasone;Moist Heat;Balance training;Therapeutic exercise;Therapeutic activities;Functional mobility training;Stair training;Gait training;Neuromuscular re-education;Patient/family education;Dry needling;Manual techniques;Taping;Vasopneumatic Device    PT Next Visit Plan Work on [employer]. Single leg stability, improve ankle PF strength, progress HEP    PT Home Exercise Plan [email or web address]    Consulted and Agree with Plan of Care Patient        [facility] Gordy Levan, PT, DPT 03/18/2022, 8:45 AM

## 2022-03-20 ENCOUNTER — Encounter: Payer: BC Managed Care – PPO | Admitting: Physical Therapy

## 2022-03-24 ENCOUNTER — Encounter: Payer: BC Managed Care – PPO | Admitting: Physical Therapy

## 2022-03-25 ENCOUNTER — Ambulatory Visit: Payer: BC Managed Care – PPO | Admitting: Physical Therapy

## 2022-03-25 ENCOUNTER — Encounter: Payer: Self-pay | Admitting: Physical Therapy

## 2022-03-25 DIAGNOSIS — M25672 Stiffness of left ankle, not elsewhere classified: Secondary | ICD-10-CM | POA: Diagnosis not present

## 2022-03-25 DIAGNOSIS — R2689 Other abnormalities of gait and mobility: Secondary | ICD-10-CM

## 2022-03-25 DIAGNOSIS — R262 Difficulty in walking, not elsewhere classified: Secondary | ICD-10-CM

## 2022-03-25 DIAGNOSIS — M25572 Pain in left ankle and joints of left foot: Secondary | ICD-10-CM

## 2022-03-25 DIAGNOSIS — M6281 Muscle weakness (generalized): Secondary | ICD-10-CM

## 2022-03-25 DIAGNOSIS — R600 Localized edema: Secondary | ICD-10-CM

## 2022-03-25 NOTE — Therapy (Signed)
OUTPATIENT PHYSICAL THERAPY TREATMENT NOTE   Patient Name: Heidi Crawford MRN: [id number] DOB:Oct 16, 1960, 61 y.o., female Today's Date: 03/25/2022  PCP: Heidi Crawford PROVIDER: Meridee Score   PT End of Session - 03/25/22 0847     Visit Number 25    Date for PT Re-Evaluation 04/03/22    Authorization Type BCBS    PT Start Time 0845    PT Stop Time 0925    PT Time Calculation (min) 40 min    Activity Tolerance Patient tolerated treatment well    Behavior During Therapy WFL for tasks assessed/performed               Past Medical History:  Diagnosis Date   Allergy    seasonal   Dependent edema    GERD (gastroesophageal reflux disease)    Headache(784.0)    Hyperlipemia    Hyperlipidemia    Hypertension    Pneumonia    Squamous cell cancer of skin of forearm    right arm    TMJ pain dysfunction syndrome    Past Surgical History:  Procedure Laterality Date   COLONOSCOPY  09/08/2014   per Dr. Henrene Crawford, clear, repeat in 10 yrs    [place] Left 10/25/2021   Procedure: EXCISION LEFT HAGLUND'S;  Surgeon: Heidi Minion, MD;  Location: [facility];  Service: Orthopedics;  Laterality: Left;   removal skin cancer  2014   from right arm per Dr. Delman Crawford    Patient Active Problem List   Diagnosis Date Noted   Haglund's deformity of left heel    GERD (gastroesophageal reflux disease) 05/20/2019   Hyperlipidemia 05/04/2012   Essential hypertension 03/05/2009   COUGH 12/27/2007   ALLERGIC RHINITIS 12/03/2007   DEPENDENT EDEMA 11/10/2007   TMJ PAIN 03/04/2007   HEADACHE 03/04/2007    REFERRING DIAG: L-5 Haglund's deformity + achilles tendon repair 10/25/21 [facility]  THERAPY DIAG:  Stiffness of left ankle, not elsewhere classified  Other abnormalities of gait and mobility  Muscle weakness (generalized)  Difficulty walking  Localized edema  Pain in left ankle and joints of left foot  Rationale for  Evaluation and Treatment Rehabilitation  PERTINENT HISTORY: L-5 Haglund's deformity + achilles tendon repair 10/25/21  PRECAUTIONS: None  SUBJECTIVE: Pt reports she has been continuing to work on exercises. No longer using the compression as much.      PAIN:  Are you having pain? No   TODAY'S TREATMENT:  03/25/22  L1 x 5 min recumbent bike   Standing   Knee forward flexion 2x10   Gastroc stretch 2x30 sec   Soleus stretch 2x30 sec   R foot on 4" step, L foot heel raise x10; trial of eccentrics x5   6" step up 2x10   4" step down 2x10   Knee bending, foot propped on step x10   SLS 2x30 sec  Sitting   Sit<>stand with small hop 2x10     03/18/22  L1 x 6 min recumbent bike  Standing   Gastroc stretch 2x30 sec   Soleus stretch 2x30 sec   Knee forward flexion 2x10   6" step step up 2x10   Tandem stance heel raise 2x10   Iso heel raise hold 2x10   6" step down 2x10    Neuro re-ed SLS 3x10 Rockerboard forward/bwd 2x10 Rockerboard L<>R 2x10 Rockerboard static balance A/P & laterally 2x30 sec each   03/06/22  Standing   Gastroc stretch 2x30  sec   Soleus stretch 2x30 sec   Knee forward flexion on step x30 sec   SLS 2x30 sec   Mini squat 2x10   Tandem stance heel raise 2x10   Heel raise 2x10   Seated   Ankle inv green tband 2x10   Manual therapy   Grade II to III talocrural distraction and ankle DF   Hindfoot inv/ev grade II to III   Metatarsal PAs grade II to III   On slight grassy incline:   Forwards/backwards walking 2x30'   Lateral stepping 4x30'   Machine strengthening   Ankle PF with knees straight @ 5 plates SL on L 4R74   Ankle PF with knees bent @ 5 plates SL on L 0C14   PATIENT EDUCATION: Education details: See above Person educated: Patient Education method: Explanation, Verbal cues, and Handouts Education comprehension: verbalized understanding, returned demonstration, and needs further education   HOME EXERCISE PROGRAM: Access Code:  [email or web address] URL: [email or web address] Date: 02/18/2022 Prepared by: Heidi Crawford  Exercises - Squat to Heel Raise  - 1 x daily - 7 x weekly - 2 sets - 10 reps - Soleus Heel Raise in Stride Stance Position  - 1 x daily - 7 x weekly - 2 sets - 10 reps - Forward Step Down Touch with Heel  - 1 x daily - 7 x weekly - 2 sets - 10 reps - Standing Soleus Stretch on Step  - 1 x daily - 7 x weekly - 1 sets - 10 reps     PT Short Term Goals - 01/27/22 0911       PT SHORT TERM GOAL #1   Title Ind with initial HEP    Baseline -    Time 2    Period Weeks    Status Achieved    Target Date 12/26/21      PT SHORT TERM GOAL #2   Title Pt able to ascend/descend her steps safely in CAM boot with or without AD and one UE assist.    Time 2    Period Weeks    Status Achieved    Target Date 12/26/21              REVISED GOALS: To be completed by 04/03/2022  1.  Heidi Crawford will be independent with her long-term HEP at [place] for return to community wellness Baseline:  Goal status: IN PROGRESS  2.  Demonstrate 5/5 L ankle strength Baseline: 5/5 ankle inv; not able to perform full single leg heel raise on L Goal status: IN PROGRESS  3.  Improve FOTO to 63 showing functional improvement  Baseline: 59 on 03/06/22 Goal status: IN PROGRESS  4.  Pt will be able to perform SLS on L LE for at least 10 sec to demo improved L foot/ankle stability Baseline:  Goal status: MET     Plan    Clinical Impression Statement Pt able to demo improving ankle DF -- able to get knee to wall this session in half kneeling. Has difficulty with stair descent without compensation. Focused primarily on soleus stretching and strengthening. Initiated double leg plyometrics. Pt still not yet able to perform single leg heel raise.    Examination-Activity Limitations Locomotion Level;Stairs    Stability/Clinical Decision Making Stable/Uncomplicated    Rehab Potential Excellent    PT Frequency 1x /  week    PT Duration 4 weeks    PT Treatment/Interventions ADLs/Self Care Home Management;Aquatic Therapy;Cryotherapy;Electrical Stimulation;Iontophoresis 4mg /ml Dexamethasone;Moist Heat;Balance training;Therapeutic exercise;Therapeutic activities;Functional  mobility training;Stair training;Gait training;Neuromuscular re-education;Patient/family education;Dry needling;Manual techniques;Taping;Vasopneumatic Device    PT Next Visit Plan Work on [employer]. Single leg stability, improve ankle PF strength, progress HEP    PT Home Exercise Plan [email or web address]    Consulted and Agree with Plan of Care Patient        [facility] Gordy Levan, PT, DPT 03/25/2022, 8:47 AM

## 2022-04-01 ENCOUNTER — Encounter: Payer: Self-pay | Admitting: Physical Therapy

## 2022-04-01 ENCOUNTER — Ambulatory Visit: Payer: BC Managed Care – PPO | Admitting: Physical Therapy

## 2022-04-01 DIAGNOSIS — M25672 Stiffness of left ankle, not elsewhere classified: Secondary | ICD-10-CM | POA: Diagnosis not present

## 2022-04-01 DIAGNOSIS — R2689 Other abnormalities of gait and mobility: Secondary | ICD-10-CM

## 2022-04-01 DIAGNOSIS — R600 Localized edema: Secondary | ICD-10-CM

## 2022-04-01 DIAGNOSIS — M25572 Pain in left ankle and joints of left foot: Secondary | ICD-10-CM

## 2022-04-01 DIAGNOSIS — M6281 Muscle weakness (generalized): Secondary | ICD-10-CM

## 2022-04-01 DIAGNOSIS — R262 Difficulty in walking, not elsewhere classified: Secondary | ICD-10-CM

## 2022-04-01 NOTE — Therapy (Signed)
OUTPATIENT PHYSICAL THERAPY TREATMENT NOTE AND DISCHARGE   Patient Name: Heidi Crawford MRN: [id number] DOB:09-19-1960, 61 y.o., female Today's Date: 04/01/2022  PCP: Maia Petties PROVIDER: Meridee Score   PT End of Session - 04/01/22 0851     Visit Number 26    Date for PT Re-Evaluation 04/03/22    Authorization Type BCBS    PT Start Time 0850    PT Stop Time 0930    PT Time Calculation (min) 40 min    Activity Tolerance Patient tolerated treatment well    Behavior During Therapy WFL for tasks assessed/performed               Past Medical History:  Diagnosis Date   Allergy    seasonal   Dependent edema    GERD (gastroesophageal reflux disease)    Headache(784.0)    Hyperlipemia    Hyperlipidemia    Hypertension    Pneumonia    Squamous cell cancer of skin of forearm    right arm    TMJ pain dysfunction syndrome    Past Surgical History:  Procedure Laterality Date   COLONOSCOPY  09/08/2014   per Dr. Henrene Pastor, clear, repeat in 10 yrs    [place] Left 10/25/2021   Procedure: EXCISION LEFT HAGLUND'S;  Surgeon: Newt Minion, MD;  Location: [facility];  Service: Orthopedics;  Laterality: Left;   removal skin cancer  2014   from right arm per Dr. Delman Cheadle    Patient Active Problem List   Diagnosis Date Noted   Haglund's deformity of left heel    GERD (gastroesophageal reflux disease) 05/20/2019   Hyperlipidemia 05/04/2012   Essential hypertension 03/05/2009   COUGH 12/27/2007   ALLERGIC RHINITIS 12/03/2007   DEPENDENT EDEMA 11/10/2007   TMJ PAIN 03/04/2007   HEADACHE 03/04/2007    REFERRING DIAG: L-5 Haglund's deformity + achilles tendon repair 10/25/21 [facility]  THERAPY DIAG:  Stiffness of left ankle, not elsewhere classified  Other abnormalities of gait and mobility  Muscle weakness (generalized)  Difficulty walking  Localized edema  Pain in left ankle and joints of left  foot  Rationale for Evaluation and Treatment Rehabilitation  PERTINENT HISTORY: L-5 Haglund's deformity + achilles tendon repair 10/25/21  PRECAUTIONS: None  SUBJECTIVE: Pt reports she has been walking and doing fine. Some swelling noted after prolonged walking.      PAIN:  Are you having pain? No  OBJECTIVE:  FOTO: 69 on 04/01/22  TODAY'S TREATMENT:  04/01/22  L5 x 5 min Nustep LEs only  Standing   Gastroc stretch 2x30 sec   Soleus stretch 2x30 sec   Knee forward flexion on step x10 with manual assist   R foot on step, L heel raise on floor 2x10 knees straight and then knees bent   Knee flexion into wall in half kneeling 2x10 with manual assist   Single leg squat with 1 hand hold 2x10   Stair descent 1 hand hold 2x10 L foot on step -- foam roll to cue increased forward knee flexion   Stair descent no hand hold 2x10 -- foam roll to cue increased forward knee flexion and weight shift    03/25/22  L1 x 5 min recumbent bike   Standing   Knee forward flexion 2x10   Gastroc stretch 2x30 sec   Soleus stretch 2x30 sec   R foot on 4" step, L foot heel raise x10; trial of eccentrics x5  6" step up 2x10   4" step down 2x10   Knee bending, foot propped on step x10   SLS 2x30 sec  Sitting   Sit<>stand with small hop 2x10     03/18/22  L1 x 6 min recumbent bike  Standing   Gastroc stretch 2x30 sec   Soleus stretch 2x30 sec   Knee forward flexion 2x10   6" step step up 2x10   Tandem stance heel raise 2x10   Iso heel raise hold 2x10   6" step down 2x10    Neuro re-ed SLS 3x10 Rockerboard forward/bwd 2x10 Rockerboard L<>R 2x10 Rockerboard static balance A/P & laterally 2x30 sec each   03/06/22  Standing   Gastroc stretch 2x30 sec   Soleus stretch 2x30 sec   Knee forward flexion on step x30 sec   SLS 2x30 sec   Mini squat 2x10   Tandem stance heel raise 2x10   Heel raise 2x10   Seated   Ankle inv green tband 2x10   Manual therapy   Grade II to III talocrural  distraction and ankle DF   Hindfoot inv/ev grade II to III   Metatarsal PAs grade II to III   On slight grassy incline:   Forwards/backwards walking 2x30'   Lateral stepping 4x30'   Machine strengthening   Ankle PF with knees straight @ 5 plates SL on L 0Y63   Ankle PF with knees bent @ 5 plates SL on L 7C58   PATIENT EDUCATION: Education details: See above Person educated: Patient Education method: Explanation, Verbal cues, and Handouts Education comprehension: verbalized understanding, returned demonstration, and needs further education   HOME EXERCISE PROGRAM: Access Code: [email or web address] URL: [email or web address] Date: 02/18/2022 Prepared by: Estill Bamberg April Thurnell Garbe  Exercises - Squat to Heel Raise  - 1 x daily - 7 x weekly - 2 sets - 10 reps - Soleus Heel Raise in Stride Stance Position  - 1 x daily - 7 x weekly - 2 sets - 10 reps - Forward Step Down Touch with Heel  - 1 x daily - 7 x weekly - 2 sets - 10 reps - Standing Soleus Stretch on Step  - 1 x daily - 7 x weekly - 1 sets - 10 reps     PT Short Term Goals - 01/27/22 0911       PT SHORT TERM GOAL #1   Title Ind with initial HEP    Baseline -    Time 2    Period Weeks    Status Achieved    Target Date 12/26/21      PT SHORT TERM GOAL #2   Title Pt able to ascend/descend her steps safely in CAM boot with or without AD and one UE assist.    Time 2    Period Weeks    Status Achieved    Target Date 12/26/21              REVISED GOALS: To be completed by 04/03/2022  1.  Heidi Crawford will be independent with her long-term HEP at DC for return to community wellness Baseline:  Goal status: MET  2.  Demonstrate 5/5 L ankle strength Baseline: 5/5 ankle inv; not able to perform full single leg heel raise on L Goal status: Partially met  3.  Improve FOTO to 63 showing functional improvement  Baseline: 69 on 9/26 Goal status: MET  4.  Pt will be able to perform SLS on L LE for at least 10 sec to  demo improved L foot/ankle stability Baseline:  Goal status: MET     Plan    Clinical Impression Statement Pt has met or partially met all of her goals. Pt has functional ROM in her ankle. Still not capable of a full heel raise on left but able to get 50%. Pt able to work on this at home. Still hesitant with stair descent -- worked on new exercises for improved cueing and weightshift. Pt feels ready for PT d/c to continue to work on ROM and strengthening at home.   Examination-Activity Limitations Locomotion Level;Stairs    Stability/Clinical Decision Making Stable/Uncomplicated    Rehab Potential Excellent    PT Frequency 1x / week    PT Duration 4 weeks    PT Treatment/Interventions ADLs/Self Care Home Management;Aquatic Therapy;Cryotherapy;Electrical Stimulation;Iontophoresis 42m/ml Dexamethasone;Moist Heat;Balance training;Therapeutic exercise;Therapeutic activities;Functional mobility training;Stair training;Gait training;Neuromuscular re-education;Patient/family education;Dry needling;Manual techniques;Taping;Vasopneumatic Device    PT Next Visit Plan Work on [employer] Single leg stability, improve ankle PF strength, progress HEP    PT Home Exercise Plan [email or web address]   Consulted and Agree with Plan of Care Patient        [employer] [employer] L PMGordy Levan PT, DPT 04/01/2022, 8:51 AM

## 2022-04-04 ENCOUNTER — Ambulatory Visit (INDEPENDENT_AMBULATORY_CARE_PROVIDER_SITE_OTHER): Payer: BC Managed Care – PPO | Admitting: *Deleted

## 2022-04-04 DIAGNOSIS — Z23 Encounter for immunization: Secondary | ICD-10-CM

## 2022-04-07 ENCOUNTER — Encounter: Payer: Self-pay | Admitting: Family Medicine

## 2022-04-08 ENCOUNTER — Encounter: Payer: BC Managed Care – PPO | Admitting: Physical Therapy

## 2022-04-08 NOTE — Telephone Encounter (Signed)
Have her see [place] for an OV first to make sure there is no cerumen in her ear canals

## 2022-04-15 ENCOUNTER — Encounter: Payer: BC Managed Care – PPO | Admitting: Physical Therapy

## 2022-04-22 ENCOUNTER — Other Ambulatory Visit (HOSPITAL_BASED_OUTPATIENT_CLINIC_OR_DEPARTMENT_OTHER): Payer: Self-pay

## 2022-04-22 MED ORDER — COMIRNATY 30 MCG/0.3ML IM SUSY
PREFILLED_SYRINGE | INTRAMUSCULAR | 0 refills | Status: DC
  Filled 2022-04-22: qty 0.3, 1d supply, fill #0

## 2022-06-03 ENCOUNTER — Ambulatory Visit (INDEPENDENT_AMBULATORY_CARE_PROVIDER_SITE_OTHER): Payer: BC Managed Care – PPO | Admitting: Family Medicine

## 2022-06-03 ENCOUNTER — Encounter: Payer: Self-pay | Admitting: Family Medicine

## 2022-06-03 VITALS — BP 118/78 | HR 78 | Temp 98.0°F | Wt 193.1 lb

## 2022-06-03 DIAGNOSIS — J020 Streptococcal pharyngitis: Secondary | ICD-10-CM

## 2022-06-03 DIAGNOSIS — R0989 Other specified symptoms and signs involving the circulatory and respiratory systems: Secondary | ICD-10-CM | POA: Diagnosis not present

## 2022-06-03 LAB — POCT RAPID STREP A (OFFICE): Rapid Strep A Screen: POSITIVE — AB

## 2022-06-03 LAB — POCT INFLUENZA A/B
Influenza A, POC: NEGATIVE
Influenza B, POC: NEGATIVE

## 2022-06-03 LAB — POC COVID19 BINAXNOW: SARS Coronavirus 2 Ag: NEGATIVE

## 2022-06-03 MED ORDER — CEFUROXIME AXETIL 500 MG PO TABS: 500.0000 mg | ORAL_TABLET | Freq: Two times a day (BID) | ORAL | 0 refills | Status: AC

## 2022-06-03 NOTE — Progress Notes (Signed)
   Subjective:    Patient ID: Heidi Crawford, female    DOB: [birth year], 61 y.o.   MRN: [id number]  HPI Here for 4 days of upper chest congestion and a dry cough. No ST or headache or fever. Using Alka Seltzer Cold and Cough.    Review of Systems  Constitutional: Negative.   HENT:  Positive for congestion. Negative for ear pain, postnasal drip, sinus pressure, sore throat and trouble swallowing.   Eyes: Negative.   Respiratory:  Positive for cough. Negative for shortness of breath and wheezing.        Objective:   Physical Exam Constitutional:      Appearance: Normal appearance. She is not ill-appearing.  HENT:     Right Ear: Tympanic membrane, ear canal and external ear normal.     Left Ear: Tympanic membrane, ear canal and external ear normal.     Nose: Nose normal.     Mouth/Throat:     Pharynx: Oropharynx is clear.  Eyes:     Conjunctiva/sclera: Conjunctivae normal.  Cardiovascular:     Rate and Rhythm: Normal rate and regular rhythm.     Pulses: Normal pulses.     Heart sounds: Normal heart sounds.  Pulmonary:     Effort: Pulmonary effort is normal.     Breath sounds: Normal breath sounds.  Lymphadenopathy:     Cervical: No cervical adenopathy.  Neurological:     Mental Status: She is alert.           Assessment & Plan:  Strep pharyngitis, treat with 10 days of Cefuroxime.  Alysia Penna, MD

## 2022-06-04 ENCOUNTER — Encounter: Payer: Self-pay | Admitting: Family Medicine

## 2022-06-04 NOTE — Telephone Encounter (Signed)
The letter to excuse her from jury duty is ready

## 2022-06-09 DIAGNOSIS — L820 Inflamed seborrheic keratosis: Secondary | ICD-10-CM | POA: Diagnosis not present

## 2022-06-10 DIAGNOSIS — H1045 Other chronic allergic conjunctivitis: Secondary | ICD-10-CM | POA: Diagnosis not present

## 2022-06-10 DIAGNOSIS — D3132 Benign neoplasm of left choroid: Secondary | ICD-10-CM | POA: Diagnosis not present

## 2022-06-10 DIAGNOSIS — H2513 Age-related nuclear cataract, bilateral: Secondary | ICD-10-CM | POA: Diagnosis not present

## 2022-06-10 DIAGNOSIS — H04123 Dry eye syndrome of bilateral lacrimal glands: Secondary | ICD-10-CM | POA: Diagnosis not present

## 2022-06-12 ENCOUNTER — Other Ambulatory Visit: Payer: Self-pay | Admitting: Family Medicine

## 2022-09-12 ENCOUNTER — Other Ambulatory Visit: Payer: Self-pay | Admitting: Family Medicine

## 2022-09-15 ENCOUNTER — Other Ambulatory Visit: Payer: Self-pay | Admitting: Family Medicine

## 2022-10-07 ENCOUNTER — Other Ambulatory Visit: Payer: Self-pay | Admitting: Family Medicine

## 2022-10-11 ENCOUNTER — Other Ambulatory Visit: Payer: Self-pay | Admitting: Family Medicine

## 2022-10-15 DIAGNOSIS — Z01419 Encounter for gynecological examination (general) (routine) without abnormal findings: Secondary | ICD-10-CM | POA: Diagnosis not present

## 2022-10-15 DIAGNOSIS — Z130 Encounter for screening for diseases of the blood and blood-forming organs and certain disorders involving the immune mechanism: Secondary | ICD-10-CM | POA: Diagnosis not present

## 2022-10-15 DIAGNOSIS — Z6835 Body mass index (BMI) 35.0-35.9, adult: Secondary | ICD-10-CM | POA: Diagnosis not present

## 2022-10-15 DIAGNOSIS — Z1389 Encounter for screening for other disorder: Secondary | ICD-10-CM | POA: Diagnosis not present

## 2022-10-15 DIAGNOSIS — Z1231 Encounter for screening mammogram for malignant neoplasm of breast: Secondary | ICD-10-CM | POA: Diagnosis not present

## 2022-10-15 LAB — HM MAMMOGRAPHY

## 2022-10-20 ENCOUNTER — Ambulatory Visit: Payer: BC Managed Care – PPO | Admitting: Orthopedic Surgery

## 2022-10-20 DIAGNOSIS — M9262 Juvenile osteochondrosis of tarsus, left ankle: Secondary | ICD-10-CM | POA: Diagnosis not present

## 2022-10-26 ENCOUNTER — Encounter: Payer: Self-pay | Admitting: Orthopedic Surgery

## 2022-10-26 NOTE — Progress Notes (Signed)
Office Visit Note   Patient: Heidi Crawford           Date of Birth: 04-Dec-1960           MRN: [id number] Visit Date: 10/20/2022              Requested by: Nelwyn Salisbury, MD [address] [place],  [place] [address] PCP: Nelwyn Salisbury, MD  Chief Complaint  Patient presents with   Left Foot - Pain      HPI: Patient is a 62 year old woman who is 1 year status post left foot excision Haglund's deformity.  Patient states she is asymptomatic at the surgical area patient states she has been having swelling over the anterior lateral ankle that is not painful.  No pain over the Achilles.  Assessment & Plan: Visit Diagnoses:  1. Haglund's deformity of left heel     Plan: Recommended compression for the venous insufficiency.  Follow-Up Instructions: No follow-ups on file.   Ortho Exam  Patient is alert, oriented, no adenopathy, well-dressed, normal affect, normal respiratory effort. Examination the Achilles tendon has good function no palpable defect no tenderness to palpation.  There is no pain to palpation over the sinus Tarsi she does have a fat pad over the sinus Tarsi that is nontender to palpation.  She has no pain with range of motion of the ankle or subtalar joint.  Imaging: No results found. No images are attached to the encounter.  Labs: Lab Results  Component Value Date   HGBA1C 5.4 06/11/2021     Lab Results  Component Value Date   ALBUMIN 4.4 06/11/2021   ALBUMIN 4.6 12/23/2018   ALBUMIN 4.2 11/13/2017    No results found for: "MG" No results found for: "VD25OH"  No results found for: "PREALBUMIN"    Latest Ref Rng & Units 10/25/2021    5:57 AM 06/11/2021   10:51 AM 12/23/2018    9:34 AM  CBC EXTENDED  WBC 4.0 - 10.5 K/uL 5.5  5.5  6.1   RBC 3.87 - 5.11 MIL/uL 4.91  4.76  4.86   Hemoglobin 12.0 - 15.0 g/dL 19.1  47.8  29.5   HCT 36.0 - 46.0 % 44.4  42.2  43.4   Platelets 150 - 400 K/uL 270  258.0  265.0   NEUT# 1.4 - 7.7 K/uL  3.6  4.3    Lymph# 0.7 - 4.0 K/uL  1.4  1.1      There is no height or weight on file to calculate BMI.  Orders:  No orders of the defined types were placed in this encounter.  No orders of the defined types were placed in this encounter.    Procedures: No procedures performed  Clinical Data: No additional findings.  ROS:  All other systems negative, except as noted in the HPI. Review of Systems  Objective: Vital Signs: There were no vitals taken for this visit.  Specialty Comments:  No specialty comments available.  PMFS History: Patient Active Problem List   Diagnosis Date Noted   Haglund's deformity of left heel    GERD (gastroesophageal reflux disease) 05/20/2019   Hyperlipidemia 05/04/2012   Essential hypertension 03/05/2009   COUGH 12/27/2007   ALLERGIC RHINITIS 12/03/2007   DEPENDENT EDEMA 11/10/2007   TMJ PAIN 03/04/2007   HEADACHE 03/04/2007   Past Medical History:  Diagnosis Date   Allergy    seasonal   Dependent edema    GERD (gastroesophageal reflux disease)    Headache(784.0)  Hyperlipemia    Hyperlipidemia    Hypertension    Pneumonia    Squamous cell cancer of skin of forearm    right arm    TMJ pain dysfunction syndrome     Family History  Problem Relation Age of Onset   Alcohol abuse Unknown        fhx   Arthritis Unknown        fhx   Hyperlipidemia Unknown        fhx   Hypertension Unknown        fhx   Heart disease Unknown        fhx   Stroke Unknown        fhx   Hypertension Mother    Hypertension Father    Colon cancer Neg Hx    Rectal cancer Neg Hx    Stomach cancer Neg Hx     Past Surgical History:  Procedure Laterality Date   COLONOSCOPY  09/08/2014   per Dr. Marina Goodell, clear, repeat in 10 yrs    DENTAL SURGERY     EXCISION HAGLUND'S DEFORMITY WITH ACHILLES TENDON REPAIR Left 10/25/2021   Procedure: EXCISION LEFT HAGLUND'S;  Surgeon: Nadara Mustard, MD;  Location: [facility];  Service: Orthopedics;  Laterality: Left;   removal  skin cancer  2014   from right arm per Dr. Emily Filbert    Social History   Occupational History   Not on file  Tobacco Use   Smoking status: Former    Types: Cigarettes    Quit date: 07/07/2000    Years since quitting: 22.3   Smokeless tobacco: Never  Vaping Use   Vaping Use: Never used  Substance and Sexual Activity   Alcohol use: No    Alcohol/week: 0.0 standard drinks of alcohol   Drug use: No   Sexual activity: Not on file

## 2022-11-08 ENCOUNTER — Other Ambulatory Visit: Payer: Self-pay | Admitting: Family Medicine

## 2022-11-28 ENCOUNTER — Telehealth: Payer: BC Managed Care – PPO | Admitting: Family Medicine

## 2022-11-28 ENCOUNTER — Encounter: Payer: Self-pay | Admitting: Family Medicine

## 2022-11-28 ENCOUNTER — Telehealth: Payer: BC Managed Care – PPO | Admitting: Adult Health

## 2022-11-28 VITALS — Ht 62.0 in | Wt 185.0 lb

## 2022-11-28 DIAGNOSIS — U071 COVID-19: Secondary | ICD-10-CM

## 2022-11-28 MED ORDER — NIRMATRELVIR/RITONAVIR (PAXLOVID)TABLET: 3.0000 | ORAL_TABLET | Freq: Two times a day (BID) | ORAL | 0 refills | Status: AC

## 2022-11-28 NOTE — Progress Notes (Signed)
Virtual Visit via Video   I connected with patient on 11/28/22 at 10:40 AM EDT by a video enabled telemedicine application and verified that I am speaking with the correct person using two identifiers.  Location patient: Home Location provider: [facility] [facility], Office Persons participating in the virtual visit: Patient, Provider, CMA Sheryle Hail C)  I discussed the limitations of evaluation and management by telemedicine and the availability of in person appointments. The patient expressed understanding and agreed to proceed.  Subjective:   HPI:   COVID- was in [place] for 4 days w/ over 120 people.  Woke this AM w/ 'scratchy throat' and cough.  No fever.  + COVID test this morning.  No body aches, no chills.  No chills, SOB, HA.    ROS:   See pertinent positives and negatives per HPI.  Patient Active Problem List   Diagnosis Date Noted   Haglund's deformity of left heel    GERD (gastroesophageal reflux disease) 05/20/2019   Hyperlipidemia 05/04/2012   Essential hypertension 03/05/2009   COUGH 12/27/2007   ALLERGIC RHINITIS 12/03/2007   DEPENDENT EDEMA 11/10/2007   TMJ PAIN 03/04/2007   HEADACHE 03/04/2007    Social History   Tobacco Use   Smoking status: Former    Types: Cigarettes    Quit date: 07/07/2000    Years since quitting: 22.4   Smokeless tobacco: Never  Substance Use Topics   Alcohol use: No    Alcohol/week: 0.0 standard drinks of alcohol    Current Outpatient Medications:    chlorpheniramine (CHLOR-TRIMETON) 4 MG tablet, Take 4 mg by mouth at bedtime., Disp: , Rfl:    furosemide (LASIX) 40 MG tablet, TAKE 1 TABLET BY MOUTH EVERY DAY, Disp: 90 tablet, Rfl: 0   omeprazole (PRILOSEC) 40 MG capsule, Take 1 capsule (40 mg total) by mouth daily. (Patient taking differently: Take 40 mg by mouth daily as needed (acid reflux).), Disp: 90 capsule, Rfl: 3   simvastatin (ZOCOR) 20 MG tablet, TAKE 1 TABLET BY MOUTH EVERYDAY AT BEDTIME, Disp: 30 tablet, Rfl: 0    valsartan (DIOVAN) 80 MG tablet, TAKE 1 TABLET BY MOUTH EVERY DAY, Disp: 90 tablet, Rfl: 0   COVID-19 mRNA bivalent vaccine, Pfizer, (PFIZER COVID-19 VAC BIVALENT) injection, Inject into the muscle. (Patient not taking: Reported on 11/28/2022), Disp: 0.3 mL, Rfl: 0   COVID-19 mRNA vaccine 2023-2024 (COMIRNATY) syringe, Inject into the muscle. (Patient not taking: Reported on 11/28/2022), Disp: 0.3 mL, Rfl: 0   HYDROcodone-acetaminophen (NORCO/VICODIN) 5-325 MG tablet, Take 1 tablet by mouth every 4 (four) hours as needed. (Patient not taking: Reported on 11/28/2022), Disp: 30 tablet, Rfl: 0  Allergies  Allergen Reactions   Augmentin [Amoxicillin-Pot Clavulanate] Diarrhea   Penicillins Hives    Objective:   Ht 5\' 2"  (1.575 m)   Wt 185 lb (83.9 kg)   BMI 33.84 kg/m  AAOx3, NAD NCAT, EOMI No obvious CN deficits Coloring WNL Pt is able to speak clearly, coherently without shortness of breath or increased work of breathing.  Thought process is linear.  Mood is appropriate.   Assessment and Plan:   COVID- pt w/ very mild sxs but + test this morning.  Unclear as to when this started given her lack of sxs.  Will assume this is day 1 and will send Paxlovid for her to take if sxs worsen over the weekend.  Otherwise she will treat supportively.  She is aware she needs to hold Simvastatin if she starts Paxlovid.  Pt expressed understanding and  is in agreement w/ plan.    Neena Rhymes, MD 11/28/2022

## 2022-12-02 ENCOUNTER — Encounter: Payer: Self-pay | Admitting: Family Medicine

## 2022-12-02 NOTE — Telephone Encounter (Signed)
Spoke with pt offered a VV with another provider since pt PCP is not available, stated that most of her symptoms have gone away but just feeling extremely fatigued. Pt stated that she will see how she feels in the next 2 days and if any need to see a Dr she will call the office back.

## 2022-12-13 ENCOUNTER — Other Ambulatory Visit: Payer: Self-pay | Admitting: Family Medicine

## 2022-12-18 ENCOUNTER — Encounter: Payer: Self-pay | Admitting: Family Medicine

## 2022-12-18 ENCOUNTER — Ambulatory Visit (INDEPENDENT_AMBULATORY_CARE_PROVIDER_SITE_OTHER): Payer: BC Managed Care – PPO | Admitting: Family Medicine

## 2022-12-18 VITALS — BP 124/78 | HR 80 | Temp 98.0°F | Ht 62.0 in | Wt 192.0 lb

## 2022-12-18 DIAGNOSIS — Z0000 Encounter for general adult medical examination without abnormal findings: Secondary | ICD-10-CM

## 2022-12-18 LAB — BASIC METABOLIC PANEL
BUN: 13 mg/dL (ref 6–23)
CO2: 30 mEq/L (ref 19–32)
Calcium: 9.3 mg/dL (ref 8.4–10.5)
Chloride: 100 mEq/L (ref 96–112)
Creatinine, Ser: 0.84 mg/dL (ref 0.40–1.20)
GFR: 74.61 mL/min (ref >60.00)
Glucose, Bld: 102 mg/dL — ABNORMAL HIGH (ref 70–99)
Potassium: 3.8 mEq/L (ref 3.5–5.1)
Sodium: 140 mEq/L (ref 135–145)

## 2022-12-18 LAB — CBC WITH DIFFERENTIAL/PLATELET
Basophils Absolute: 0.1 10*3/uL (ref 0.0–0.1)
Basophils Relative: 1.1 % (ref 0.0–3.0)
Eosinophils Absolute: 0.2 10*3/uL (ref 0.0–0.7)
Eosinophils Relative: 3.3 % (ref 0.0–5.0)
HCT: 44 % (ref 36.0–46.0)
Hemoglobin: 14.5 g/dL (ref 12.0–15.0)
Lymphocytes Relative: 24.6 % (ref 12.0–46.0)
Lymphs Abs: 1.4 10*3/uL (ref 0.7–4.0)
MCHC: 33 g/dL (ref 30.0–36.0)
MCV: 88.5 fl (ref 78.0–100.0)
Monocytes Absolute: 0.5 10*3/uL (ref 0.1–1.0)
Monocytes Relative: 8.1 % (ref 3.0–12.0)
Neutro Abs: 3.6 10*3/uL (ref 1.4–7.7)
Neutrophils Relative %: 62.9 % (ref 43.0–77.0)
Platelets: 326 10*3/uL (ref 150.0–400.0)
RBC: 4.98 Mil/uL (ref 3.87–5.11)
RDW: 12.9 % (ref 11.5–15.5)
WBC: 5.7 10*3/uL (ref 4.0–10.5)

## 2022-12-18 LAB — TSH: TSH: 1.29 u[IU]/mL (ref 0.35–5.50)

## 2022-12-18 LAB — LIPID PANEL
Cholesterol: 170 mg/dL (ref 0–200)
HDL: 55.2 mg/dL (ref >39.00)
LDL Cholesterol: 94 mg/dL (ref 0–99)
NonHDL: 115
Total CHOL/HDL Ratio: 3
Triglycerides: 104 mg/dL (ref 0.0–149.0)
VLDL: 20.8 mg/dL (ref 0.0–40.0)

## 2022-12-18 LAB — HEMOGLOBIN A1C: Hgb A1c MFr Bld: 5.4 % (ref 4.6–6.5)

## 2022-12-18 LAB — HEPATIC FUNCTION PANEL
ALT: 14 U/L (ref 0–35)
AST: 15 U/L (ref 0–37)
Albumin: 4.4 g/dL (ref 3.5–5.2)
Alkaline Phosphatase: 124 U/L — ABNORMAL HIGH (ref 39–117)
Bilirubin, Direct: 0.1 mg/dL (ref 0.0–0.3)
Total Bilirubin: 0.6 mg/dL (ref 0.2–1.2)
Total Protein: 7.7 g/dL (ref 6.0–8.3)

## 2022-12-18 MED ORDER — SIMVASTATIN 20 MG PO TABS: ORAL_TABLET | ORAL | 3 refills | Status: DC

## 2022-12-18 MED ORDER — FUROSEMIDE 40 MG PO TABS: 40.0000 mg | ORAL_TABLET | Freq: Every day | ORAL | 3 refills | Status: DC

## 2022-12-18 MED ORDER — OMEPRAZOLE 40 MG PO CPDR
40.0000 mg | DELAYED_RELEASE_CAPSULE | Freq: Every day | ORAL | 3 refills | Status: AC
Start: 2022-12-18 — End: ?

## 2022-12-18 MED ORDER — VALSARTAN 80 MG PO TABS: 80.0000 mg | ORAL_TABLET | Freq: Every day | ORAL | 3 refills | Status: DC

## 2022-12-18 NOTE — Progress Notes (Signed)
   Subjective:    Patient ID: Heidi Crawford, female    DOB: 10/06/1960, 62 y.o.   MRN: [id number]  HPI Here for a well exam. She feels well except her dry persistent cough has returned. She says this was well controled when she was taking Omeprazole, so she wants to get back on this daily.    Review of Systems  Constitutional: Negative.   HENT: Negative.    Eyes: Negative.   Respiratory:  Positive for cough.   Cardiovascular: Negative.   Gastrointestinal: Negative.   Genitourinary:  Negative for decreased urine volume, difficulty urinating, dyspareunia, dysuria, enuresis, flank pain, frequency, hematuria, pelvic pain and urgency.  Musculoskeletal: Negative.   Skin: Negative.   Neurological: Negative.  Negative for headaches.  Psychiatric/Behavioral: Negative.         Objective:   Physical Exam Constitutional:      General: She is not in acute distress.    Appearance: Normal appearance. She is well-developed.  HENT:     Head: Normocephalic and atraumatic.     Right Ear: External ear normal.     Left Ear: External ear normal.     Nose: Nose normal.     Mouth/Throat:     Pharynx: No oropharyngeal exudate.  Eyes:     General: No scleral icterus.    Conjunctiva/sclera: Conjunctivae normal.     Pupils: Pupils are equal, round, and reactive to light.  Neck:     Thyroid: No thyromegaly.     Vascular: No JVD.  Cardiovascular:     Rate and Rhythm: Normal rate and regular rhythm.     Pulses: Normal pulses.     Heart sounds: Normal heart sounds. No murmur heard.    No friction rub. No gallop.  Pulmonary:     Effort: Pulmonary effort is normal. No respiratory distress.     Breath sounds: Normal breath sounds. No wheezing or rales.  Chest:     Chest wall: No tenderness.  Abdominal:     General: Bowel sounds are normal. There is no distension.     Palpations: Abdomen is soft. There is no mass.     Tenderness: There is no abdominal tenderness. There is no guarding or rebound.   Musculoskeletal:        General: No tenderness. Normal range of motion.     Cervical back: Normal range of motion and neck supple.  Lymphadenopathy:     Cervical: No cervical adenopathy.  Skin:    General: Skin is warm and dry.     Findings: No erythema or rash.  Neurological:     General: No focal deficit present.     Mental Status: She is alert and oriented to person, place, and time.     Cranial Nerves: No cranial nerve deficit.     Motor: No abnormal muscle tone.     Coordination: Coordination normal.     Deep Tendon Reflexes: Reflexes are normal and symmetric. Reflexes normal.  Psychiatric:        Behavior: Behavior normal.        Thought Content: Thought content normal.        Judgment: Judgment normal.           Assessment & Plan:  Well exam. We discussed diet and exercise. Get fasting labs. Start back on Omeprazole 40 mg daily.  Gershon Crane, MD

## 2023-03-18 ENCOUNTER — Ambulatory Visit: Payer: BC Managed Care – PPO | Admitting: Family Medicine

## 2023-03-18 ENCOUNTER — Encounter: Payer: Self-pay | Admitting: Family Medicine

## 2023-03-18 VITALS — BP 120/78 | HR 92 | Temp 97.8°F | Wt 195.0 lb

## 2023-03-18 DIAGNOSIS — K219 Gastro-esophageal reflux disease without esophagitis: Secondary | ICD-10-CM | POA: Diagnosis not present

## 2023-03-18 DIAGNOSIS — R059 Cough, unspecified: Secondary | ICD-10-CM

## 2023-03-18 MED ORDER — FAMOTIDINE 40 MG PO TABS: 40.0000 mg | ORAL_TABLET | Freq: Every evening | ORAL | 3 refills | Status: AC

## 2023-03-18 NOTE — Progress Notes (Signed)
   Subjective:    Patient ID: Heidi Crawford, female    DOB: Apr 12, 1961, 62 y.o.   MRN: [id number]  HPI Here for several months of a dry cough that is always worse at night in bed. She takes Omeprazole every morning, and this controls her heartburn. She says she can sometimes feel stomach contents coming up into the back of her mouth at night, despite the fact that she has the head of her bed elevated.    Review of Systems  Constitutional: Negative.   Respiratory:  Positive for cough. Negative for shortness of breath and wheezing.   Cardiovascular: Negative.        Objective:   Physical Exam Constitutional:      Appearance: Normal appearance.  Cardiovascular:     Rate and Rhythm: Normal rate and regular rhythm.     Pulses: Normal pulses.     Heart sounds: Normal heart sounds.  Pulmonary:     Effort: Pulmonary effort is normal.     Breath sounds: Normal breath sounds.  Neurological:     Mental Status: She is alert.           Assessment & Plan:  Night time cough, which is likely due to GERD. We will add Famotidine 40 mg every evening 30 minutes before supper. Follow up as needed.  Gershon Crane, MD

## 2023-03-19 ENCOUNTER — Ambulatory Visit: Payer: BC Managed Care – PPO | Admitting: Family Medicine

## 2023-04-17 ENCOUNTER — Ambulatory Visit (INDEPENDENT_AMBULATORY_CARE_PROVIDER_SITE_OTHER): Payer: BC Managed Care – PPO

## 2023-04-17 DIAGNOSIS — Z23 Encounter for immunization: Secondary | ICD-10-CM

## 2023-04-24 ENCOUNTER — Ambulatory Visit: Payer: BC Managed Care – PPO

## 2023-05-20 ENCOUNTER — Ambulatory Visit: Payer: BC Managed Care – PPO | Admitting: Family Medicine

## 2023-05-20 VITALS — BP 124/72 | HR 63 | Temp 98.3°F | Wt 195.0 lb

## 2023-05-20 DIAGNOSIS — J019 Acute sinusitis, unspecified: Secondary | ICD-10-CM

## 2023-05-20 MED ORDER — CEFUROXIME AXETIL 500 MG PO TABS: 500.0000 mg | ORAL_TABLET | Freq: Two times a day (BID) | ORAL | 0 refills | Status: AC

## 2023-05-20 NOTE — Progress Notes (Signed)
   Subjective:    Patient ID: Heidi Crawford, female    DOB: July 21, 1960, 62 y.o.   MRN: [id number]  HPI Here for 5 days of sinus congestion, PND, a dry cough, and blowing green mucus form the nose. No fever. She tested negative for Covid at home.    Review of Systems  Constitutional: Negative.   HENT:  Positive for congestion, postnasal drip and sinus pressure. Negative for ear pain and sore throat.   Eyes: Negative.   Respiratory:  Positive for cough. Negative for shortness of breath and wheezing.        Objective:   Physical Exam Constitutional:      Appearance: Normal appearance.  HENT:     Right Ear: Tympanic membrane, ear canal and external ear normal.     Left Ear: Tympanic membrane, ear canal and external ear normal.     Nose: Nose normal.     Mouth/Throat:     Pharynx: Oropharynx is clear.  Eyes:     Conjunctiva/sclera: Conjunctivae normal.  Pulmonary:     Effort: Pulmonary effort is normal.     Breath sounds: Normal breath sounds.  Lymphadenopathy:     Cervical: No cervical adenopathy.  Neurological:     Mental Status: She is alert.           Assessment & Plan:  Sinusitis, treat with 10 days of Cefuroxime.  Gershon Crane, MD

## 2023-07-03 ENCOUNTER — Encounter: Payer: Self-pay | Admitting: Family Medicine

## 2023-07-03 ENCOUNTER — Ambulatory Visit (INDEPENDENT_AMBULATORY_CARE_PROVIDER_SITE_OTHER): Payer: BC Managed Care – PPO

## 2023-07-03 ENCOUNTER — Telehealth: Payer: BC Managed Care – PPO | Admitting: Family Medicine

## 2023-07-03 VITALS — BP 118/76 | HR 80 | Temp 97.8°F | Wt 194.0 lb

## 2023-07-03 DIAGNOSIS — R053 Chronic cough: Secondary | ICD-10-CM | POA: Diagnosis not present

## 2023-07-03 DIAGNOSIS — R0989 Other specified symptoms and signs involving the circulatory and respiratory systems: Secondary | ICD-10-CM | POA: Diagnosis not present

## 2023-07-03 MED ORDER — METOCLOPRAMIDE HCL 10 MG PO TABS: 10.0000 mg | ORAL_TABLET | Freq: Every day | ORAL | 2 refills | Status: AC

## 2023-07-03 MED ORDER — FEXOFENADINE HCL 180 MG PO TABS: 180.0000 mg | ORAL_TABLET | Freq: Every day | ORAL | Status: AC

## 2023-07-03 NOTE — Progress Notes (Signed)
   Subjective:    Patient ID: Heidi Crawford, female    DOB: 1961-03-06, 62 y.o.   MRN: [id number]  HPI Here for her ongoing chronic cough. She has had this for at least 15 years. She has seen Pulmonology and ENT, and everyone agrees that GERD is the cause. She takes acid suppressing medications BID, and she has raised the head of her bed. The cough is dry, and it comes and goes. No SOB.    Review of Systems  Constitutional: Negative.   HENT: Negative.    Eyes: Negative.   Respiratory:  Positive for cough. Negative for shortness of breath and wheezing.   Cardiovascular: Negative.        Objective:   Physical Exam Constitutional:      Appearance: Normal appearance.     Comments: She coughs twice during the exam   Cardiovascular:     Rate and Rhythm: Normal rate and regular rhythm.     Pulses: Normal pulses.     Heart sounds: Normal heart sounds.  Pulmonary:     Effort: Pulmonary effort is normal.     Breath sounds: Normal breath sounds.  Neurological:     Mental Status: She is alert.           Assessment & Plan:  Chronic cough, likely due to night time GERD. She will try taking Metoclopramide 10 mg at bedtime. Get a CXR today (her last one was in 2009).  Gershon Crane, MD

## 2023-09-15 ENCOUNTER — Ambulatory Visit: Admitting: Family Medicine

## 2023-09-24 ENCOUNTER — Other Ambulatory Visit (INDEPENDENT_AMBULATORY_CARE_PROVIDER_SITE_OTHER): Payer: Self-pay

## 2023-09-24 ENCOUNTER — Ambulatory Visit: Admitting: Orthopedic Surgery

## 2023-09-24 ENCOUNTER — Encounter: Payer: Self-pay | Admitting: Orthopedic Surgery

## 2023-09-24 DIAGNOSIS — M9261 Juvenile osteochondrosis of tarsus, right ankle: Secondary | ICD-10-CM

## 2023-09-24 DIAGNOSIS — M25571 Pain in right ankle and joints of right foot: Secondary | ICD-10-CM | POA: Diagnosis not present

## 2023-09-24 NOTE — Progress Notes (Signed)
 Office Visit Note   Patient: Heidi Crawford           Date of Birth: 05/03/1961           MRN: [id number] Visit Date: 09/24/2023              Requested by: Nelwyn Salisbury, MD [address] [place],  [place] [address] PCP: Nelwyn Salisbury, MD  Chief Complaint  Patient presents with   Right Ankle - Edema      HPI: Patient is a 63 year old woman who presents for Haglund's deformity on the right.  She is status post Haglund's resection on the left which has healed well and she is asymptomatic.  Patient states she is not symptomatic on the right at this time.  Assessment & Plan: Visit Diagnoses:  1. Pain in right ankle and joints of right foot   2. Haglund's deformity of right heel     Plan: Will continue with conservative treatment for the right Haglund's deformity.  If it becomes symptomatic we would consider for resection.  Follow-Up Instructions: Return if symptoms worsen or fail to improve.   Ortho Exam  Patient is alert, oriented, no adenopathy, well-dressed, normal affect, normal respiratory effort. Examination of right foot patient has a good dorsalis pedis and posterior tibial pulse.  She has good dorsiflexion of the ankle.  She does have a prominent Haglund's deformity which is not tender to palpation there is no skin ulcers no redness.  Imaging: No results found. No images are attached to the encounter.  Labs: Lab Results  Component Value Date   HGBA1C 5.4 12/18/2022   HGBA1C 5.4 06/11/2021     Lab Results  Component Value Date   ALBUMIN 4.4 12/18/2022   ALBUMIN 4.4 06/11/2021   ALBUMIN 4.6 12/23/2018    No results found for: "MG" No results found for: "VD25OH"  No results found for: "PREALBUMIN"    Latest Ref Rng & Units 12/18/2022    8:46 AM 10/25/2021    5:57 AM 06/11/2021   10:51 AM  CBC EXTENDED  WBC 4.0 - 10.5 K/uL 5.7  5.5  5.5   RBC 3.87 - 5.11 Mil/uL 4.98  4.91  4.76   Hemoglobin 12.0 - 15.0 g/dL 19.1  47.8  29.5   HCT 36.0 - 46.0 %  44.0  44.4  42.2   Platelets 150.0 - 400.0 K/uL 326.0  270  258.0   NEUT# 1.4 - 7.7 K/uL 3.6   3.6   Lymph# 0.7 - 4.0 K/uL 1.4   1.4      There is no height or weight on file to calculate BMI.  Orders:  Orders Placed This Encounter  Procedures   XR Ankle 2 Views Right   No orders of the defined types were placed in this encounter.    Procedures: No procedures performed  Clinical Data: No additional findings.  ROS:  All other systems negative, except as noted in the HPI. Review of Systems  Objective: Vital Signs: There were no vitals taken for this visit.  Specialty Comments:  No specialty comments available.  PMFS History: Patient Active Problem List   Diagnosis Date Noted   Haglund's deformity of left heel    GERD (gastroesophageal reflux disease) 05/20/2019   Hyperlipidemia 05/04/2012   Essential hypertension 03/05/2009   COUGH 12/27/2007   Allergic rhinitis 12/03/2007   DEPENDENT EDEMA 11/10/2007   TMJ PAIN 03/04/2007   Headache 03/04/2007   Past Medical History:  Diagnosis Date  Allergy    seasonal   Dependent edema    GERD (gastroesophageal reflux disease)    Headache(784.0)    Hyperlipemia    Hyperlipidemia    Hypertension    Pneumonia    Squamous cell cancer of skin of forearm    right arm    TMJ pain dysfunction syndrome     Family History  Problem Relation Age of Onset   Alcohol abuse Unknown        fhx   Arthritis Unknown        fhx   Hyperlipidemia Unknown        fhx   Hypertension Unknown        fhx   Heart disease Unknown        fhx   Stroke Unknown        fhx   Hypertension Mother    Hypertension Father    Colon cancer Neg Hx    Rectal cancer Neg Hx    Stomach cancer Neg Hx     Past Surgical History:  Procedure Laterality Date   COLONOSCOPY  09/08/2014   per Dr. Marina Goodell, clear, repeat in 10 yrs    DENTAL SURGERY     EXCISION HAGLUND'S DEFORMITY WITH ACHILLES TENDON REPAIR Left 10/25/2021   Procedure: EXCISION LEFT  HAGLUND'S;  Surgeon: Nadara Mustard, MD;  Location: [facility] OR;  Service: Orthopedics;  Laterality: Left;   removal skin cancer  2014   from right arm per Dr. Emily Filbert    Social History   Occupational History   Not on file  Tobacco Use   Smoking status: Former    Current packs/day: 0.00    Types: Cigarettes    Quit date: 07/07/2000    Years since quitting: 23.2   Smokeless tobacco: Never  Vaping Use   Vaping status: Never Used  Substance and Sexual Activity   Alcohol use: No    Alcohol/week: 0.0 standard drinks of alcohol   Drug use: No   Sexual activity: Not on file

## 2023-11-03 DIAGNOSIS — Z1231 Encounter for screening mammogram for malignant neoplasm of breast: Secondary | ICD-10-CM | POA: Diagnosis not present

## 2023-11-03 DIAGNOSIS — Z01419 Encounter for gynecological examination (general) (routine) without abnormal findings: Secondary | ICD-10-CM | POA: Diagnosis not present

## 2023-11-16 ENCOUNTER — Ambulatory Visit
Admission: RE | Admit: 2023-11-16 | Discharge: 2023-11-16 | Disposition: A | Discharge status: Home / Self Care | Source: Ambulatory Visit | Attending: Family Medicine | Admitting: Family Medicine

## 2023-11-16 VITALS — BP 159/90 | HR 90 | Temp 97.5°F | Resp 16

## 2023-11-16 DIAGNOSIS — N3001 Acute cystitis with hematuria: Secondary | ICD-10-CM | POA: Diagnosis not present

## 2023-11-16 DIAGNOSIS — R350 Frequency of micturition: Secondary | ICD-10-CM | POA: Diagnosis not present

## 2023-11-16 LAB — POCT URINALYSIS DIP (MANUAL ENTRY)
Glucose, UA: NEGATIVE mg/dL
Nitrite, UA: POSITIVE — AB
Protein Ur, POC: >=300 mg/dL — AB
Spec Grav, UA: 1.025 (ref 1.010–1.025)
Urobilinogen, UA: 0.2 U/dL
pH, UA: 6 (ref 5.0–8.0)

## 2023-11-16 MED ORDER — SULFAMETHOXAZOLE-TRIMETHOPRIM 800-160 MG PO TABS: 1.0000 | ORAL_TABLET | Freq: Two times a day (BID) | ORAL | 0 refills | Status: AC

## 2023-11-16 NOTE — Discharge Instructions (Addendum)
 Advised patient to take medication as directed with food to completion.  Encouraged increase daily water intake to 64 ounces per day while taking this medication.  Advised we will follow-up with urine culture results once received.  Advised if symptoms worsen and/or unresolved please follow-up with your PCP, urology or here for further evaluation.

## 2023-11-16 NOTE — ED Provider Notes (Signed)
 [facility]    CSN: [id number] Arrival date & time: 11/16/23  1519      History   Chief Complaint Chief Complaint  Patient presents with   Urinary Frequency    possible UTI or bladder infection - Entered by patient    HPI Heidi Crawford is a 63 y.o. female.   HPI pleasant 63 year old female presents with dysuria and urinary frequency since this morning.  Patient is current for UTI.  PMH significant for morbid obesity, dependent edema, and HTN.  Past Medical History:  Diagnosis Date   Allergy    seasonal   Dependent edema    GERD (gastroesophageal reflux disease)    Headache(784.0)    Hyperlipemia    Hyperlipidemia    Hypertension    Pneumonia    Squamous cell cancer of skin of forearm    right arm    TMJ pain dysfunction syndrome     Patient Active Problem List   Diagnosis Date Noted   Haglund's deformity of left heel    GERD (gastroesophageal reflux disease) 05/20/2019   Hyperlipidemia 05/04/2012   Essential hypertension 03/05/2009   COUGH 12/27/2007   Allergic rhinitis 12/03/2007   DEPENDENT EDEMA 11/10/2007   TMJ PAIN 03/04/2007   Headache 03/04/2007    Past Surgical History:  Procedure Laterality Date   COLONOSCOPY  09/08/2014   per Dr. Elvin Hammer, clear, repeat in 10 yrs    DENTAL SURGERY     EXCISION HAGLUND'S DEFORMITY WITH ACHILLES TENDON REPAIR Left 10/25/2021   Procedure: EXCISION LEFT HAGLUND'S;  Surgeon: Timothy Ford, MD;  Location: [facility] OR;  Service: Orthopedics;  Laterality: Left;   removal skin cancer  2014   from right arm per Dr. Joanne Muckle     OB History   No obstetric history on file.      Home Medications    Prior to Admission medications   Medication Sig Start Date End Date Taking? Authorizing Provider  sulfamethoxazole-trimethoprim (BACTRIM DS) 800-160 MG tablet Take 1 tablet by mouth 2 (two) times daily for 7 days. 11/16/23 11/23/23 Yes Leonides Ramp, FNP  famotidine  (PEPCID ) 40 MG tablet Take 1 tablet (40 mg total) by mouth  every evening. 03/18/23   Donley Furth, MD  fexofenadine  (ALLEGRA ) 180 MG tablet Take 1 tablet (180 mg total) by mouth daily. 07/03/23   Donley Furth, MD  furosemide  (LASIX ) 40 MG tablet Take 1 tablet (40 mg total) by mouth daily. 12/18/22   Donley Furth, MD  metoCLOPramide  (REGLAN ) 10 MG tablet Take 1 tablet (10 mg total) by mouth at bedtime. 07/03/23   Donley Furth, MD  omeprazole  (PRILOSEC) 40 MG capsule Take 1 capsule (40 mg total) by mouth daily. 12/18/22   Donley Furth, MD  simvastatin  (ZOCOR ) 20 MG tablet TAKE 1 TABLET BY MOUTH EVERYDAY AT BEDTIME 12/18/22   Donley Furth, MD  valsartan  (DIOVAN ) 80 MG tablet Take 1 tablet (80 mg total) by mouth daily. 12/18/22   Donley Furth, MD    Family History Family History  Problem Relation Age of Onset   Alcohol abuse Unknown        fhx   Arthritis Unknown        fhx   Hyperlipidemia Unknown        fhx   Hypertension Unknown        fhx   Heart disease Unknown        fhx   Stroke Unknown  fhx   Hypertension Mother    Hypertension Father    Colon cancer Neg Hx    Rectal cancer Neg Hx    Stomach cancer Neg Hx     Social History Social History   Tobacco Use   Smoking status: Former    Current packs/day: 0.00    Types: Cigarettes    Quit date: 07/07/2000    Years since quitting: 23.3   Smokeless tobacco: Never  Vaping Use   Vaping status: Never Used  Substance Use Topics   Alcohol use: No    Alcohol/week: 0.0 standard drinks of alcohol   Drug use: No     Allergies   Augmentin [amoxicillin-pot clavulanate] and Penicillins   Review of Systems Review of Systems  Genitourinary:  Positive for dysuria and frequency.  All other systems reviewed and are negative.    Physical Exam Triage Vital Signs ED Triage Vitals [11/16/23 1550]  Encounter Vitals Group     BP      Systolic BP Percentile      Diastolic BP Percentile      Pulse      Resp      Temp      Temp src      SpO2      Weight      Height       Head Circumference      Peak Flow      Pain Score 0     Pain Loc      Pain Education      Exclude from Growth Chart    No data found.  Updated Vital Signs BP (!) 159/90   Pulse 90   Temp (!) 97.5 F (36.4 C)   Resp 16   SpO2 98%    Physical Exam Vitals and nursing note reviewed.  Constitutional:      Appearance: Normal appearance. She is obese.  HENT:     Head: Normocephalic and atraumatic.     Mouth/Throat:     Mouth: Mucous membranes are moist.     Pharynx: Oropharynx is clear.  Eyes:     Extraocular Movements: Extraocular movements intact.     Conjunctiva/sclera: Conjunctivae normal.     Pupils: Pupils are equal, round, and reactive to light.  Cardiovascular:     Rate and Rhythm: Normal rate and regular rhythm.     Pulses: Normal pulses.     Heart sounds: Normal heart sounds.  Pulmonary:     Effort: Pulmonary effort is normal.     Breath sounds: Normal breath sounds. No wheezing, rhonchi or rales.  Musculoskeletal:        General: Normal range of motion.     Cervical back: Normal range of motion and neck supple.  Skin:    General: Skin is warm and dry.  Neurological:     General: No focal deficit present.     Mental Status: She is alert and oriented to person, place, and time. Mental status is at baseline.  Psychiatric:        Mood and Affect: Mood normal.        Behavior: Behavior normal.      UC Treatments / Results  Labs (all labs ordered are listed, but only abnormal results are displayed) Labs Reviewed  POCT URINALYSIS DIP (MANUAL ENTRY) - Abnormal; Notable for the following components:      Result Value   Color, UA brown (*)    Clarity, UA cloudy (*)    Bilirubin,  UA small (*)    Ketones, POC UA trace (5) (*)    Blood, UA large (*)    Protein Ur, POC >=300 (*)    Nitrite, UA Positive (*)    Leukocytes, UA Large (3+) (*)    All other components within normal limits  URINE CULTURE    EKG   Radiology No results  found.  Procedures Procedures (including critical care time)  Medications Ordered in UC Medications - No data to display  Initial Impression / Assessment and Plan / UC Course  I have reviewed the triage vital signs and the nursing notes.  Pertinent labs & imaging results that were available during my care of the patient were reviewed by me and considered in my medical decision making (see chart for details).     MDM: 1.  Acute cystitis with hematuria-Rx'd Bactrim DS 800/160 mg tablet: Take 1 tablet twice daily x 7 days, UA revealed above, urine culture ordered; 2.  Urinary frequency-UA revealed above, urine culture ordered, Rx'd Bactrim DS 800/160 mg tablet: Take 1 tablet twice daily x 7 days. Advised patient to take medication as directed with food to completion.  Encouraged increase daily water intake to 64 ounces per day while taking this medication.  Advised we will follow-up with urine culture results once received.  Advised if symptoms worsen and/or unresolved please follow-up with your PCP, urology or here for further evaluation.  Patient discharged home, hemodynamically stable. Final Clinical Impressions(s) / UC Diagnoses   Final diagnoses:  Urinary frequency  Acute cystitis with hematuria     Discharge Instructions      Advised patient to take medication as directed with food to completion.  Encouraged increase daily water intake to 64 ounces per day while taking this medication.  Advised we will follow-up with urine culture results once received.  Advised if symptoms worsen and/or unresolved please follow-up with your PCP, urology or here for further evaluation.   ED Prescriptions     Medication Sig Dispense Auth. Provider   sulfamethoxazole-trimethoprim (BACTRIM DS) 800-160 MG tablet Take 1 tablet by mouth 2 (two) times daily for 7 days. 14 tablet Kathern Lobosco, FNP      PDMP not reviewed this encounter.   Leonides Ramp, FNP 11/16/23 1622

## 2023-11-16 NOTE — ED Triage Notes (Signed)
 Pt presents to [facility] with co dysuria and urinary frequency since this morning. Pt reports she noticed hematuria on her last restroom use concerned for uti

## 2023-11-18 ENCOUNTER — Ambulatory Visit (HOSPITAL_COMMUNITY): Payer: Self-pay

## 2023-11-18 LAB — URINE CULTURE: Culture: >=100000 — AB

## 2023-12-22 ENCOUNTER — Other Ambulatory Visit: Payer: Self-pay | Admitting: Family Medicine

## 2023-12-23 ENCOUNTER — Other Ambulatory Visit: Payer: Self-pay | Admitting: Family Medicine

## 2023-12-24 ENCOUNTER — Other Ambulatory Visit (HOSPITAL_BASED_OUTPATIENT_CLINIC_OR_DEPARTMENT_OTHER): Payer: Self-pay

## 2023-12-24 MED ORDER — ZOSTER VAC RECOMB ADJUVANTED 50 MCG/0.5ML IM SUSR
0.5000 mL | Freq: Once | INTRAMUSCULAR | 0 refills | Status: AC
  Filled 2023-12-24: qty 0.5, 1d supply, fill #0

## 2024-02-23 ENCOUNTER — Other Ambulatory Visit (HOSPITAL_BASED_OUTPATIENT_CLINIC_OR_DEPARTMENT_OTHER): Payer: Self-pay

## 2024-02-23 MED ORDER — ZOSTER VAC RECOMB ADJUVANTED 50 MCG/0.5ML IM SUSR
0.5000 mL | Freq: Once | INTRAMUSCULAR | 0 refills | Status: AC
Start: 2024-02-23 — End: 2024-02-24
  Filled 2024-02-23: qty 0.5, 1d supply, fill #0

## 2024-03-19 ENCOUNTER — Other Ambulatory Visit: Payer: Self-pay | Admitting: Family Medicine

## 2024-04-19 ENCOUNTER — Other Ambulatory Visit (HOSPITAL_BASED_OUTPATIENT_CLINIC_OR_DEPARTMENT_OTHER): Payer: Self-pay

## 2024-04-19 MED ORDER — FLUZONE 0.5 ML IM SUSY
0.5000 mL | PREFILLED_SYRINGE | Freq: Once | INTRAMUSCULAR | 0 refills | Status: AC
  Filled 2024-04-19: qty 0.5, 1d supply, fill #0

## 2024-04-20 ENCOUNTER — Encounter: Payer: Self-pay | Admitting: Family Medicine

## 2024-04-20 ENCOUNTER — Ambulatory Visit: Admitting: Family Medicine

## 2024-04-20 VITALS — BP 126/78 | HR 89 | Temp 98.2°F | Wt 187.0 lb

## 2024-04-20 DIAGNOSIS — M79671 Pain in right foot: Secondary | ICD-10-CM | POA: Diagnosis not present

## 2024-04-20 NOTE — Progress Notes (Signed)
   Subjective:    Patient ID: Heidi Crawford, female    DOB: 09-04-1960, 63 y.o.   MRN: [id number]  HPI Here for one week of intermittent pain in the right foot. No recent trauma, but she recently retired and she has been walking with her husband 3 days a week. She recently increase the walking from 20 minutes to an hour at a time.    Review of Systems  Constitutional: Negative.   Respiratory: Negative.    Cardiovascular: Negative.   Musculoskeletal:  Positive for arthralgias.       Objective:   Physical Exam Constitutional:      Appearance: Normal appearance.  Cardiovascular:     Rate and Rhythm: Normal rate and regular rhythm.     Pulses: Normal pulses.     Heart sounds: Normal heart sounds.  Pulmonary:     Effort: Pulmonary effort is normal.     Breath sounds: Normal breath sounds.  Musculoskeletal:     Comments: Right foot appears normal with no swelling or erythema. She is tender over a specific area on the medial dorsal foot   Neurological:     Mental Status: She is alert.           Assessment & Plan:  Foot pain, likely a tendonitis. This likely came from increased wear and tear with her walking. I suggested she switch to a non-weightbearing type of exercise such as riding a bike or swimming. Use Ibuprofen as needed. This should resolve in the next few weeks.  Garnette Olmsted, MD

## 2024-05-09 ENCOUNTER — Encounter: Payer: Self-pay | Admitting: Radiology

## 2024-06-22 ENCOUNTER — Other Ambulatory Visit: Payer: Self-pay | Admitting: Family Medicine
